# Patient Record
Sex: Female | Born: 1960
Health system: Southern US, Community
[De-identification: ages and names within clinical notes are randomized; demographics above are authoritative.]

## PROBLEM LIST (undated history)

## (undated) DIAGNOSIS — F988 Other specified behavioral and emotional disorders with onset usually occurring in childhood and adolescence: Secondary | ICD-10-CM

## (undated) DIAGNOSIS — Z9109 Other allergy status, other than to drugs and biological substances: Secondary | ICD-10-CM

## (undated) DIAGNOSIS — I10 Essential (primary) hypertension: Secondary | ICD-10-CM

## (undated) DIAGNOSIS — K589 Irritable bowel syndrome without diarrhea: Secondary | ICD-10-CM

## (undated) DIAGNOSIS — F419 Anxiety disorder, unspecified: Secondary | ICD-10-CM

## (undated) DIAGNOSIS — J3081 Allergic rhinitis due to animal (cat) (dog) hair and dander: Secondary | ICD-10-CM

## (undated) HISTORY — DX: Anxiety disorder, unspecified: F41.9

## (undated) HISTORY — PX: SKIN TAG REMOVAL: SHX780

## (undated) HISTORY — DX: Other specified behavioral and emotional disorders with onset usually occurring in childhood and adolescence: F98.8

## (undated) HISTORY — DX: Irritable bowel syndrome, unspecified: K58.9

## (undated) HISTORY — PX: WISDOM TOOTH EXTRACTION: SHX21

## (undated) HISTORY — DX: Other allergy status, other than to drugs and biological substances: Z91.09

## (undated) HISTORY — DX: Allergic rhinitis due to animal (cat) (dog) hair and dander: J30.81

---

## 2012-03-08 ENCOUNTER — Encounter (HOSPITAL_COMMUNITY): Payer: Self-pay

## 2012-03-08 ENCOUNTER — Emergency Department (HOSPITAL_COMMUNITY)
Admission: EM | Admit: 2012-03-08 | Discharge: 2012-03-08 | Disposition: A | Discharge status: Home / Self Care | Payer: BC Managed Care – PPO | Attending: Emergency Medicine | Admitting: Emergency Medicine

## 2012-03-08 ENCOUNTER — Emergency Department (HOSPITAL_COMMUNITY): Payer: BC Managed Care – PPO

## 2012-03-08 DIAGNOSIS — R072 Precordial pain: Secondary | ICD-10-CM | POA: Insufficient documentation

## 2012-03-08 DIAGNOSIS — I10 Essential (primary) hypertension: Secondary | ICD-10-CM | POA: Insufficient documentation

## 2012-03-08 HISTORY — DX: Essential (primary) hypertension: I10

## 2012-03-08 LAB — CBC
HCT: 41 % (ref 36.0–46.0)
MCH: 31.4 pg (ref 26.0–34.0)
MCHC: 34.4 g/dL (ref 30.0–36.0)
MCV: 91.3 fL (ref 78.0–100.0)
Platelets: 274 10*3/uL (ref 150–400)
RDW: 13.1 % (ref 11.5–15.5)

## 2012-03-08 LAB — BASIC METABOLIC PANEL
BUN: 12 mg/dL (ref 6–23)
Calcium: 9.7 mg/dL (ref 8.4–10.5)
Creatinine, Ser: 0.63 mg/dL (ref 0.50–1.10)
GFR calc Af Amer: >90 mL/min (ref >90)
GFR calc non Af Amer: >90 mL/min (ref >90)

## 2012-03-08 NOTE — Discharge Instructions (Signed)
Chest Pain (Nonspecific) It is often hard to give a specific diagnosis for the cause of chest pain. There is always a chance that your pain could be related to something serious, such as a heart attack or a blood clot in the lungs. You need to follow up with your caregiver for further evaluation. CAUSES   Heartburn.   Pneumonia or bronchitis.   Anxiety or stress.   Inflammation around your heart (pericarditis) or lung (pleuritis or pleurisy).   A blood clot in the lung.   A collapsed lung (pneumothorax). It can develop suddenly on its own (spontaneous pneumothorax) or from injury (trauma) to the chest.   Shingles infection (herpes zoster virus).  The chest wall is composed of bones, muscles, and cartilage. Any of these can be the source of the pain.  The bones can be bruised by injury.   The muscles or cartilage can be strained by coughing or overwork.   The cartilage can be affected by inflammation and become sore (costochondritis).  DIAGNOSIS  Lab tests or other studies, such as X-rays, electrocardiography, stress testing, or cardiac imaging, may be needed to find the cause of your pain.  TREATMENT   Treatment depends on what may be causing your chest pain. Treatment may include:   Acid blockers for heartburn.   Anti-inflammatory medicine.   Pain medicine for inflammatory conditions.   Antibiotics if an infection is present.   You may be advised to change lifestyle habits. This includes stopping smoking and avoiding alcohol, caffeine, and chocolate.   You may be advised to keep your head raised (elevated) when sleeping. This reduces the chance of acid going backward from your stomach into your esophagus.   Most of the time, nonspecific chest pain will improve within 2 to 3 days with rest and mild pain medicine.  HOME CARE INSTRUCTIONS   If antibiotics were prescribed, take your antibiotics as directed. Finish them even if you start to feel better.   For the next few  days, avoid physical activities that bring on chest pain. Continue physical activities as directed.   Do not smoke.   Avoid drinking alcohol.   Only take over-the-counter or prescription medicine for pain, discomfort, or fever as directed by your caregiver.   Follow your caregiver's suggestions for further testing if your chest pain does not go away.   Keep any follow-up appointments you made. If you do not go to an appointment, you could develop lasting (chronic) problems with pain. If there is any problem keeping an appointment, you must call to reschedule.  SEEK MEDICAL CARE IF:   You think you are having problems from the medicine you are taking. Read your medicine instructions carefully.   Your chest pain does not go away, even after treatment.   You develop a rash with blisters on your chest.  SEEK IMMEDIATE MEDICAL CARE IF:   You have increased chest pain or pain that spreads to your arm, neck, jaw, back, or abdomen.   You develop shortness of breath, an increasing cough, or you are coughing up blood.   You have severe back or abdominal pain, feel nauseous, or vomit.   You develop severe weakness, fainting, or chills.   You have a fever.  THIS IS AN EMERGENCY. Do not wait to see if the pain will go away. Get medical help at once. Call your local emergency services (911 in [place] Do not drive yourself to the hospital. MAKE SURE YOU:   Understand these instructions.     Will watch your condition.   Will get help right away if you are not doing well or get worse.  Document Released: 07/31/2005 Document Revised: 10/10/2011 Document Reviewed: 05/26/2008 Exit[employer] Patient Information 2012 [employer]. 

## 2012-03-08 NOTE — ED Notes (Signed)
Patient denies radiation of pain or SOB.

## 2012-03-08 NOTE — ED Notes (Signed)
Pt in from home with acute onset of chest pain states pain is in the center of chest (tightness) non radiating, pt denies sob/dizziness/nausea pt states a hx of anxiety

## 2012-03-08 NOTE — ED Notes (Signed)
Previous charting entry incorrect, for another pt

## 2012-03-08 NOTE — ED Provider Notes (Signed)
History     CSN: [id number]  Arrival date & time 03/08/12  1257   First MD Initiated Contact with Patient 03/08/12 1326      Chief Complaint  Patient presents with  . Chest Pain    (Consider location/radiation/quality/duration/timing/severity/associated sxs/prior treatment) The history is provided by the patient.   51 year old female with a past medical history of controlled hypertension presents to the emergency department with a chief complaint of acute onset substernal and left precordial chest pain at 1245 this afternoon. Pain began at rest, rated 6/10, nonradiating, constant, alternatingly tight and sharp. Of note, the patient reports that she took her normal Ritalin dose and to drink a large cup of coffee approximately 45 minutes before the symptoms started. Endorses increased stress recently as she has been studying for graduate school exams. Has also had recent URI, with sx present until yesterday. Pain worse with palpation, non-exertional. No identifiable alleviating factors. Took an adult-strength ASA today at the recommendation of her husband. Has had stress test and echo in 2002 to eval for CP that was thought to be related to anxiety, these were negative for concerning findings. FH sig for MI in father at 40 and CAD in multiple other family members.   Past Medical History  Diagnosis Date  . Hypertension     History reviewed. No pertinent past surgical history.  No family history on file.  History  Substance Use Topics  . Smoking status: Never Smoker   . Smokeless tobacco: Not on file  . Alcohol Use: Yes    Review of Systems 10 systems reviewed and are negative for acute change except as noted in the HPI.  Allergies  Cyclinex  Home Medications   Current Outpatient Rx  Name Route Sig Dispense Refill  . BUSPIRONE HCL 30 MG PO TABS Oral Take 30 mg by mouth 2 (two) times daily.    Marland Kitchen CITALOPRAM HYDROBROMIDE 40 MG PO TABS Oral Take 40 mg by mouth daily.    Marland Kitchen  FEXOFENADINE HCL 180 MG PO TABS Oral Take 180 mg by mouth daily.    Marland Kitchen HYDROCHLOROTHIAZIDE 25 MG PO TABS Oral Take 25 mg by mouth daily.    . IRBESARTAN 150 MG PO TABS Oral Take 150 mg by mouth daily.    . TRAZODONE HCL 50 MG PO TABS Oral Take 25 mg by mouth at bedtime.      BP 117/61  Pulse 81  Resp 20  SpO2 99%  Physical Exam  Nursing note and vitals reviewed. Constitutional: She is oriented to person, place, and time. She appears well-developed and well-nourished.       Anxious appearing, tearful  HENT:  Head: Normocephalic and atraumatic.  Mouth/Throat: Oropharynx is clear and moist. No oropharyngeal exudate.  Eyes: Conjunctivae are normal. Pupils are equal, round, and reactive to light.  Neck: Normal range of motion. Neck supple.  Cardiovascular: Normal rate, regular rhythm, normal heart sounds and intact distal pulses.  Exam reveals no friction rub.   No murmur heard. Pulmonary/Chest: Effort normal and breath sounds normal. No respiratory distress. She has no wheezes. She exhibits no tenderness.  Abdominal: Soft. Bowel sounds are normal. She exhibits no distension. There is no tenderness. There is no guarding.  Musculoskeletal: She exhibits no edema and no tenderness.  Neurological: She is alert and oriented to person, place, and time.       Normal speech  Skin: Skin is warm and dry.    ED Course  Procedures (including critical care time)  Labs Reviewed  CBC  BASIC METABOLIC PANEL  TROPONIN I   Dg Chest 2 View  03/08/2012  *RADIOLOGY REPORT*  Clinical Data: Chest pain  CHEST - 2 VIEW  Comparison: None.  Findings: No pneumothorax. Lungs clear.  Heart size and pulmonary vascularity normal.  No effusion.  Visualized bones unremarkable.  IMPRESSION: No acute disease  Original Report Authenticated By: Osa Craver, M.D.    Date: 03/08/2012  Rate: 83  Rhythm: normal sinus rhythm  QRS Axis: normal  Intervals: normal  ST/T Wave abnormalities: normal  Conduction  Disutrbances: none  Old EKG Reviewed: No prior EKG for comparison     Dx 1: Chest pain   MDM  CP with RF of HTN, Fam Hx. CP has improved in ED. Negative troponin x 2. CXR unremarkable. EKG unremarkable. CBC and BMP WNL. Pt will follow-up with PCP in the next few days to schedule further outpatient workup. Return precautions discussed. Pt and spouse agree with plan.        Shaaron Adler, [place] 03/08/12 1932

## 2012-03-08 NOTE — ED Notes (Signed)
Patient reports that the pain has decreased and is feeling more like muscular type pain.

## 2012-03-10 NOTE — ED Provider Notes (Signed)
Medical screening examination/treatment/procedure(s) were conducted as a shared visit with non-physician practitioner(s) and myself.  I personally evaluated the patient during the encounter  RRR, CTAB  Forbes Cellar, MD 03/10/12 0131

## 2012-09-02 ENCOUNTER — Other Ambulatory Visit (HOSPITAL_COMMUNITY)
Admission: RE | Admit: 2012-09-02 | Discharge: 2012-09-02 | Disposition: A | Discharge status: Home / Self Care | Payer: BC Managed Care – PPO | Source: Ambulatory Visit | Attending: Internal Medicine | Admitting: Internal Medicine

## 2012-09-02 DIAGNOSIS — Z01419 Encounter for gynecological examination (general) (routine) without abnormal findings: Secondary | ICD-10-CM | POA: Insufficient documentation

## 2013-07-20 ENCOUNTER — Telehealth (HOSPITAL_COMMUNITY): Payer: Self-pay | Admitting: *Deleted

## 2013-07-20 ENCOUNTER — Other Ambulatory Visit (HOSPITAL_COMMUNITY): Payer: Self-pay | Admitting: Internal Medicine

## 2013-07-20 DIAGNOSIS — I341 Nonrheumatic mitral (valve) prolapse: Secondary | ICD-10-CM

## 2013-07-27 ENCOUNTER — Ambulatory Visit (HOSPITAL_COMMUNITY)
Admission: RE | Admit: 2013-07-27 | Discharge: 2013-07-27 | Disposition: A | Discharge status: Home / Self Care | Payer: BC Managed Care – PPO | Source: Ambulatory Visit | Attending: Cardiology | Admitting: Cardiology

## 2013-07-27 DIAGNOSIS — I10 Essential (primary) hypertension: Secondary | ICD-10-CM | POA: Insufficient documentation

## 2013-07-27 DIAGNOSIS — I059 Rheumatic mitral valve disease, unspecified: Secondary | ICD-10-CM

## 2013-07-27 DIAGNOSIS — I341 Nonrheumatic mitral (valve) prolapse: Secondary | ICD-10-CM

## 2013-07-27 NOTE — Progress Notes (Signed)
2D Echo Performed 07/27/2013    Clearence Ped, RCS

## 2015-10-31 ENCOUNTER — Other Ambulatory Visit (HOSPITAL_COMMUNITY)
Admission: RE | Admit: 2015-10-31 | Discharge: 2015-10-31 | Disposition: A | Discharge status: Home / Self Care | Payer: BLUE CROSS/BLUE SHIELD | Source: Ambulatory Visit | Attending: Internal Medicine | Admitting: Internal Medicine

## 2015-10-31 DIAGNOSIS — Z01419 Encounter for gynecological examination (general) (routine) without abnormal findings: Secondary | ICD-10-CM | POA: Insufficient documentation

## 2015-11-01 ENCOUNTER — Encounter: Payer: Self-pay | Admitting: Gastroenterology

## 2015-11-20 ENCOUNTER — Ambulatory Visit (AMBULATORY_SURGERY_CENTER): Payer: Self-pay

## 2015-11-20 VITALS — Ht 64.5 in

## 2015-11-20 DIAGNOSIS — Z1211 Encounter for screening for malignant neoplasm of colon: Secondary | ICD-10-CM

## 2015-11-20 NOTE — Progress Notes (Signed)
No allergies to eggs or soy No diet/weight loss meds No home oxygen No past problems with anesthesia  Has email and internet; registered emmi  Refused weight check; stated she weighs between 210-215lbs

## 2015-11-29 ENCOUNTER — Encounter: Payer: Self-pay | Admitting: Gastroenterology

## 2015-11-29 ENCOUNTER — Ambulatory Visit (AMBULATORY_SURGERY_CENTER): Payer: BLUE CROSS/BLUE SHIELD | Admitting: Gastroenterology

## 2015-11-29 VITALS — BP 137/98 | HR 83 | Temp 98.3°F | Resp 27 | Ht 64.5 in | Wt 215.0 lb

## 2015-11-29 DIAGNOSIS — Z1211 Encounter for screening for malignant neoplasm of colon: Secondary | ICD-10-CM | POA: Diagnosis not present

## 2015-11-29 MED ORDER — SODIUM CHLORIDE 0.9 % IV SOLN
500.0000 mL | INTRAVENOUS | Status: DC
Start: 2015-11-29 — End: 2015-11-29

## 2015-11-29 NOTE — Patient Instructions (Signed)
YOU HAD AN ENDOSCOPIC PROCEDURE TODAY AT THE [facility]:   Refer to the procedure report that was given to you for any specific questions about what was found during the examination.  If the procedure report does not answer your questions, please call your gastroenterologist to clarify.  If you requested that your care partner not be given the details of your procedure findings, then the procedure report has been included in a sealed envelope for you to review at your convenience later.  YOU SHOULD EXPECT: Some feelings of bloating in the abdomen. Passage of more gas than usual.  Walking can help get rid of the air that was put into your GI tract during the procedure and reduce the bloating. If you had a lower endoscopy (such as a colonoscopy or flexible sigmoidoscopy) you may notice spotting of blood in your stool or on the toilet paper. If you underwent a bowel prep for your procedure, you may not have a normal bowel movement for a few days.  Please Note:  You might notice some irritation and congestion in your nose or some drainage.  This is from the oxygen used during your procedure.  There is no need for concern and it should clear up in a day or so.  SYMPTOMS TO REPORT IMMEDIATELY:   Following lower endoscopy (colonoscopy or flexible sigmoidoscopy):  Excessive amounts of blood in the stool  Significant tenderness or worsening of abdominal pains  Swelling of the abdomen that is new, acute  Fever of 100F or higher   For urgent or emergent issues, a gastroenterologist can be reached at any hour by calling [phone].   DIET: Your first meal following the procedure should be a small meal and then it is ok to progress to your normal diet. Heavy or fried foods are harder to digest and may make you feel nauseous or bloated.  Likewise, meals heavy in dairy and vegetables can increase bloating.  Drink plenty of fluids but you should avoid alcoholic beverages for 24  hours.  ACTIVITY:  You should plan to take it easy for the rest of today and you should NOT DRIVE or use heavy machinery until tomorrow (because of the sedation medicines used during the test).    FOLLOW UP: Our staff will call the number listed on your records the next business day following your procedure to check on you and address any questions or concerns that you may have regarding the information given to you following your procedure. If we do not reach you, we will leave a message.  However, if you are feeling well and you are not experiencing any problems, there is no need to return our call.  We will assume that you have returned to your regular daily activities without incident.  If any biopsies were taken you will be contacted by phone or by letter within the next 1-3 weeks.  Please call us at [phone] if you have not heard about the biopsies in 3 weeks.    SIGNATURES/CONFIDENTIALITY: You and/or your care partner have signed paperwork which will be entered into your electronic medical record.  These signatures attest to the fact that that the information above on your After Visit Summary has been reviewed and is understood.  Full responsibility of the confidentiality of this discharge information lies with you and/or your care-partner. 

## 2015-11-29 NOTE — Progress Notes (Signed)
Report to PACU, RN, vss, BBS= Clear.  

## 2015-11-29 NOTE — Op Note (Signed)
[facility] [address] [place], [address]   COLONOSCOPY PROCEDURE REPORT  PATIENT: Desiree Simpson, Desiree Simpson  MR#: [id number] BIRTHDATE: 1961/10/07 , 54  yrs. old GENDER: female ENDOSCOPIST: Marsa Aris, MD REFERRED [id number] Terri Piedra, M.D. PROCEDURE DATE:  11/29/2015 PROCEDURE:   Colonoscopy, screening First Screening Colonoscopy - Avg.  risk and is 50 yrs.  old or older Yes.  Prior Negative Screening - Now for repeat screening. N/A  History of Adenoma - Now for follow-up colonoscopy & has been > or = to 3 yrs.  N/A  Polyps removed today? No Recommend repeat exam, <10 yrs? No ASA CLASS:   Class II INDICATIONS:Screening for colonic neoplasia and Colorectal Neoplasm Risk Assessment for this procedure is average risk. MEDICATIONS: Propofol 540 mg IV  DESCRIPTION OF PROCEDURE:   After the risks benefits and alternatives of the procedure were thoroughly explained, informed consent was obtained.  The digital rectal exam revealed no abnormalities of the rectum.   The LB [id number]  endoscope was introduced through the anus and advanced to the cecum, which was identified by both the appendix and ileocecal valve. No adverse events experienced.   The quality of the prep was good.  The instrument was then slowly withdrawn as the colon was fully examined. Estimated blood loss is zero unless otherwise noted in this procedure report.   COLON FINDINGS: A normal appearing cecum, ileocecal valve, and appendiceal orifice were identified.  The ascending, transverse, descending, sigmoid colon, and rectum appeared unremarkable. Retroflexed views revealed internal hemorrhoids. The time to cecum = 6.1 Withdrawal time = 13.8   The scope was withdrawn and the procedure completed. COMPLICATIONS: There were no immediate complications.  ENDOSCOPIC IMPRESSION: Normal colonoscopy  RECOMMENDATIONS: You should continue to follow colorectal cancer screening guidelines for  "routine risk" patients with a repeat colonoscopy in 10 years. There is no need for FOBT (stool) testing for at least 5 years.  eSigned:  Marsa Aris, MD 11/29/2015 11:25 AM

## 2015-11-30 ENCOUNTER — Telehealth: Payer: Self-pay

## 2015-11-30 NOTE — Telephone Encounter (Signed)
  Follow up Call-  Call back number 11/29/2015  Post procedure Call Back phone  # [phone]  Permission to leave phone message Yes     Patient was called for follow up after her procedure on 11/29/2015. No answer at the number given for follow up phone call. A message was left on her answering machine.

## 2016-02-05 DIAGNOSIS — F4323 Adjustment disorder with mixed anxiety and depressed mood: Secondary | ICD-10-CM | POA: Diagnosis not present

## 2016-05-30 DIAGNOSIS — R509 Fever, unspecified: Secondary | ICD-10-CM | POA: Diagnosis not present

## 2016-05-30 DIAGNOSIS — J069 Acute upper respiratory infection, unspecified: Secondary | ICD-10-CM | POA: Diagnosis not present

## 2016-05-30 DIAGNOSIS — R05 Cough: Secondary | ICD-10-CM | POA: Diagnosis not present

## 2016-05-31 DIAGNOSIS — J189 Pneumonia, unspecified organism: Secondary | ICD-10-CM | POA: Diagnosis not present

## 2016-06-03 DIAGNOSIS — J45909 Unspecified asthma, uncomplicated: Secondary | ICD-10-CM | POA: Diagnosis not present

## 2016-06-03 DIAGNOSIS — R062 Wheezing: Secondary | ICD-10-CM | POA: Diagnosis not present

## 2016-06-03 DIAGNOSIS — J189 Pneumonia, unspecified organism: Secondary | ICD-10-CM | POA: Diagnosis not present

## 2016-07-01 DIAGNOSIS — J189 Pneumonia, unspecified organism: Secondary | ICD-10-CM | POA: Diagnosis not present

## 2016-07-10 DIAGNOSIS — F900 Attention-deficit hyperactivity disorder, predominantly inattentive type: Secondary | ICD-10-CM | POA: Diagnosis not present

## 2016-10-23 DIAGNOSIS — Z23 Encounter for immunization: Secondary | ICD-10-CM | POA: Diagnosis not present

## 2016-11-13 DIAGNOSIS — J189 Pneumonia, unspecified organism: Secondary | ICD-10-CM | POA: Diagnosis not present

## 2016-11-13 DIAGNOSIS — Z8701 Personal history of pneumonia (recurrent): Secondary | ICD-10-CM | POA: Diagnosis not present

## 2016-11-13 DIAGNOSIS — R6889 Other general symptoms and signs: Secondary | ICD-10-CM | POA: Diagnosis not present

## 2016-11-13 DIAGNOSIS — R5383 Other fatigue: Secondary | ICD-10-CM | POA: Diagnosis not present

## 2016-11-13 DIAGNOSIS — R509 Fever, unspecified: Secondary | ICD-10-CM | POA: Diagnosis not present

## 2017-01-07 DIAGNOSIS — M546 Pain in thoracic spine: Secondary | ICD-10-CM | POA: Diagnosis not present

## 2017-01-09 DIAGNOSIS — M546 Pain in thoracic spine: Secondary | ICD-10-CM | POA: Diagnosis not present

## 2017-01-14 DIAGNOSIS — M546 Pain in thoracic spine: Secondary | ICD-10-CM | POA: Diagnosis not present

## 2017-01-16 DIAGNOSIS — M546 Pain in thoracic spine: Secondary | ICD-10-CM | POA: Diagnosis not present

## 2017-01-21 DIAGNOSIS — M546 Pain in thoracic spine: Secondary | ICD-10-CM | POA: Diagnosis not present

## 2017-01-27 DIAGNOSIS — F411 Generalized anxiety disorder: Secondary | ICD-10-CM | POA: Diagnosis not present

## 2017-01-27 DIAGNOSIS — F900 Attention-deficit hyperactivity disorder, predominantly inattentive type: Secondary | ICD-10-CM | POA: Diagnosis not present

## 2017-07-21 DIAGNOSIS — F411 Generalized anxiety disorder: Secondary | ICD-10-CM | POA: Diagnosis not present

## 2017-07-21 DIAGNOSIS — F900 Attention-deficit hyperactivity disorder, predominantly inattentive type: Secondary | ICD-10-CM | POA: Diagnosis not present

## 2017-07-21 DIAGNOSIS — G4709 Other insomnia: Secondary | ICD-10-CM | POA: Diagnosis not present

## 2017-10-20 DIAGNOSIS — J019 Acute sinusitis, unspecified: Secondary | ICD-10-CM | POA: Diagnosis not present

## 2017-10-20 DIAGNOSIS — J4521 Mild intermittent asthma with (acute) exacerbation: Secondary | ICD-10-CM | POA: Diagnosis not present

## 2017-10-23 DIAGNOSIS — R509 Fever, unspecified: Secondary | ICD-10-CM | POA: Diagnosis not present

## 2017-10-23 DIAGNOSIS — J019 Acute sinusitis, unspecified: Secondary | ICD-10-CM | POA: Diagnosis not present

## 2017-10-23 DIAGNOSIS — R05 Cough: Secondary | ICD-10-CM | POA: Diagnosis not present

## 2017-10-23 DIAGNOSIS — J45909 Unspecified asthma, uncomplicated: Secondary | ICD-10-CM | POA: Diagnosis not present

## 2017-11-10 DIAGNOSIS — H10023 Other mucopurulent conjunctivitis, bilateral: Secondary | ICD-10-CM | POA: Diagnosis not present

## 2017-11-17 DIAGNOSIS — I10 Essential (primary) hypertension: Secondary | ICD-10-CM | POA: Diagnosis not present

## 2017-11-17 DIAGNOSIS — Z0000 Encounter for general adult medical examination without abnormal findings: Secondary | ICD-10-CM | POA: Diagnosis not present

## 2017-11-26 DIAGNOSIS — Z01419 Encounter for gynecological examination (general) (routine) without abnormal findings: Secondary | ICD-10-CM | POA: Diagnosis not present

## 2017-11-26 DIAGNOSIS — R779 Abnormality of plasma protein, unspecified: Secondary | ICD-10-CM | POA: Diagnosis not present

## 2017-11-26 DIAGNOSIS — I10 Essential (primary) hypertension: Secondary | ICD-10-CM | POA: Diagnosis not present

## 2017-11-26 DIAGNOSIS — Z1212 Encounter for screening for malignant neoplasm of rectum: Secondary | ICD-10-CM | POA: Diagnosis not present

## 2017-11-26 DIAGNOSIS — Z0000 Encounter for general adult medical examination without abnormal findings: Secondary | ICD-10-CM | POA: Diagnosis not present

## 2017-11-28 DIAGNOSIS — Z01419 Encounter for gynecological examination (general) (routine) without abnormal findings: Secondary | ICD-10-CM | POA: Diagnosis not present

## 2017-11-28 DIAGNOSIS — Z1151 Encounter for screening for human papillomavirus (HPV): Secondary | ICD-10-CM | POA: Diagnosis not present

## 2017-12-08 DIAGNOSIS — H15101 Unspecified episcleritis, right eye: Secondary | ICD-10-CM | POA: Diagnosis not present

## 2017-12-08 DIAGNOSIS — H01009 Unspecified blepharitis unspecified eye, unspecified eyelid: Secondary | ICD-10-CM | POA: Diagnosis not present

## 2017-12-08 DIAGNOSIS — H04123 Dry eye syndrome of bilateral lacrimal glands: Secondary | ICD-10-CM | POA: Diagnosis not present

## 2017-12-22 DIAGNOSIS — H01009 Unspecified blepharitis unspecified eye, unspecified eyelid: Secondary | ICD-10-CM | POA: Diagnosis not present

## 2017-12-22 DIAGNOSIS — H04123 Dry eye syndrome of bilateral lacrimal glands: Secondary | ICD-10-CM | POA: Diagnosis not present

## 2017-12-22 DIAGNOSIS — H15101 Unspecified episcleritis, right eye: Secondary | ICD-10-CM | POA: Diagnosis not present

## 2017-12-22 DIAGNOSIS — H40053 Ocular hypertension, bilateral: Secondary | ICD-10-CM | POA: Diagnosis not present

## 2018-01-12 DIAGNOSIS — F900 Attention-deficit hyperactivity disorder, predominantly inattentive type: Secondary | ICD-10-CM | POA: Diagnosis not present

## 2018-01-12 DIAGNOSIS — F3342 Major depressive disorder, recurrent, in full remission: Secondary | ICD-10-CM | POA: Diagnosis not present

## 2018-01-27 DIAGNOSIS — L918 Other hypertrophic disorders of the skin: Secondary | ICD-10-CM | POA: Diagnosis not present

## 2018-01-27 DIAGNOSIS — L219 Seborrheic dermatitis, unspecified: Secondary | ICD-10-CM | POA: Diagnosis not present

## 2018-02-02 DIAGNOSIS — L718 Other rosacea: Secondary | ICD-10-CM | POA: Diagnosis not present

## 2018-02-17 DIAGNOSIS — R0602 Shortness of breath: Secondary | ICD-10-CM | POA: Diagnosis not present

## 2018-02-17 DIAGNOSIS — J45998 Other asthma: Secondary | ICD-10-CM | POA: Diagnosis not present

## 2018-02-17 DIAGNOSIS — J309 Allergic rhinitis, unspecified: Secondary | ICD-10-CM | POA: Diagnosis not present

## 2018-03-18 DIAGNOSIS — F4325 Adjustment disorder with mixed disturbance of emotions and conduct: Secondary | ICD-10-CM | POA: Diagnosis not present

## 2018-04-01 DIAGNOSIS — F4325 Adjustment disorder with mixed disturbance of emotions and conduct: Secondary | ICD-10-CM | POA: Diagnosis not present

## 2018-04-15 DIAGNOSIS — F4325 Adjustment disorder with mixed disturbance of emotions and conduct: Secondary | ICD-10-CM | POA: Diagnosis not present

## 2018-05-06 DIAGNOSIS — F4325 Adjustment disorder with mixed disturbance of emotions and conduct: Secondary | ICD-10-CM | POA: Diagnosis not present

## 2018-05-20 DIAGNOSIS — F4325 Adjustment disorder with mixed disturbance of emotions and conduct: Secondary | ICD-10-CM | POA: Diagnosis not present

## 2018-05-26 DIAGNOSIS — F419 Anxiety disorder, unspecified: Secondary | ICD-10-CM | POA: Diagnosis not present

## 2018-05-26 DIAGNOSIS — I10 Essential (primary) hypertension: Secondary | ICD-10-CM | POA: Diagnosis not present

## 2018-05-26 DIAGNOSIS — J309 Allergic rhinitis, unspecified: Secondary | ICD-10-CM | POA: Diagnosis not present

## 2018-05-26 DIAGNOSIS — J4520 Mild intermittent asthma, uncomplicated: Secondary | ICD-10-CM | POA: Diagnosis not present

## 2018-06-03 DIAGNOSIS — F4325 Adjustment disorder with mixed disturbance of emotions and conduct: Secondary | ICD-10-CM | POA: Diagnosis not present

## 2018-06-24 DIAGNOSIS — F4325 Adjustment disorder with mixed disturbance of emotions and conduct: Secondary | ICD-10-CM | POA: Diagnosis not present

## 2018-07-07 DIAGNOSIS — F411 Generalized anxiety disorder: Secondary | ICD-10-CM | POA: Diagnosis not present

## 2018-07-07 DIAGNOSIS — F900 Attention-deficit hyperactivity disorder, predominantly inattentive type: Secondary | ICD-10-CM | POA: Diagnosis not present

## 2018-07-15 DIAGNOSIS — F4325 Adjustment disorder with mixed disturbance of emotions and conduct: Secondary | ICD-10-CM | POA: Diagnosis not present

## 2018-07-29 DIAGNOSIS — F4325 Adjustment disorder with mixed disturbance of emotions and conduct: Secondary | ICD-10-CM | POA: Diagnosis not present

## 2018-08-19 DIAGNOSIS — F4325 Adjustment disorder with mixed disturbance of emotions and conduct: Secondary | ICD-10-CM | POA: Diagnosis not present

## 2018-09-03 DIAGNOSIS — F4325 Adjustment disorder with mixed disturbance of emotions and conduct: Secondary | ICD-10-CM | POA: Diagnosis not present

## 2018-09-16 DIAGNOSIS — F4325 Adjustment disorder with mixed disturbance of emotions and conduct: Secondary | ICD-10-CM | POA: Diagnosis not present

## 2018-09-29 DIAGNOSIS — F4325 Adjustment disorder with mixed disturbance of emotions and conduct: Secondary | ICD-10-CM | POA: Diagnosis not present

## 2018-10-22 DIAGNOSIS — F4325 Adjustment disorder with mixed disturbance of emotions and conduct: Secondary | ICD-10-CM | POA: Diagnosis not present

## 2018-11-19 DIAGNOSIS — F4325 Adjustment disorder with mixed disturbance of emotions and conduct: Secondary | ICD-10-CM | POA: Diagnosis not present

## 2018-11-20 DIAGNOSIS — J019 Acute sinusitis, unspecified: Secondary | ICD-10-CM | POA: Diagnosis not present

## 2018-11-26 DIAGNOSIS — Z0000 Encounter for general adult medical examination without abnormal findings: Secondary | ICD-10-CM | POA: Diagnosis not present

## 2018-12-01 DIAGNOSIS — Z0000 Encounter for general adult medical examination without abnormal findings: Secondary | ICD-10-CM | POA: Diagnosis not present

## 2018-12-03 DIAGNOSIS — F4325 Adjustment disorder with mixed disturbance of emotions and conduct: Secondary | ICD-10-CM | POA: Diagnosis not present

## 2018-12-17 ENCOUNTER — Ambulatory Visit (INDEPENDENT_AMBULATORY_CARE_PROVIDER_SITE_OTHER): Payer: BLUE CROSS/BLUE SHIELD | Admitting: Internal Medicine

## 2018-12-17 ENCOUNTER — Encounter: Payer: Self-pay | Admitting: Internal Medicine

## 2018-12-17 VITALS — BP 124/86 | HR 100 | Temp 98.1°F

## 2018-12-17 DIAGNOSIS — J45991 Cough variant asthma: Secondary | ICD-10-CM

## 2018-12-17 DIAGNOSIS — J310 Chronic rhinitis: Secondary | ICD-10-CM

## 2018-12-17 LAB — NITRIC OXIDE: NITRIC OXIDE: 13

## 2018-12-17 MED ORDER — MONTELUKAST SODIUM 10 MG PO TABS: 10.0000 mg | ORAL_TABLET | Freq: Every day | ORAL | 11 refills | Status: DC

## 2018-12-17 MED ORDER — BUDESONIDE-FORMOTEROL FUMARATE 80-4.5 MCG/ACT IN AERO: 2.0000 | INHALATION_SPRAY | Freq: Two times a day (BID) | RESPIRATORY_TRACT | 11 refills | Status: DC

## 2018-12-17 NOTE — Progress Notes (Signed)
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 Pulmonary Consult    Referred by Merri Brunette. Pt states dxed with Asthma approx 2 years ago. She c/o having a cold for the past few wks- cough with green sputum and wheezing. She is using her albuterol inhaler about 2 x per day.   Dyspnea:  Baseline able to Heavy house work / up and down steps / worse with cat exp Cough: worse x sev weeks/rx mucinex/so restarted advair 10 days and not really better up to twice daily with minimal sputum  Sleep: some sense of drainage disturbs sleep  SABA use: last 9 pm prior to OV    No obvious day to day or daytime variability or assoc   mucus plugs or hemoptysis or cp or chest tightness,   or overt   hb symptoms.     Also denies any obvious fluctuation of symptoms with weather or environmental changes or other aggravating or alleviating factors except as outlined above   No unusual exposure hx or h/o childhood pna/ asthma or knowledge of premature birth.  Current Allergies, Complete Past Medical History, Past Surgical History, Family History, and Social History were reviewed in [employer] record.  ROS  The following are not active complaints unless bolded Hoarseness, sore throat, dysphagia, dental problems, itching, sneezing,  nasal congestion or  discharge of excess mucus or purulent secretions, ear ache,   fever, chills, sweats, unintended wt loss or wt gain, classically pleuritic or exertional cp,  orthopnea pnd or arm/hand swelling  or leg swelling, presyncope, palpitations, abdominal pain, anorexia, nausea, vomiting, diarrhea  or change in bowel habits or change in bladder habits, change in stools or change in urine, dysuria, hematuria,  rash, arthralgias, visual complaints, headache, numbness, weakness or ataxia or problems with walking or coordination,  change in mood or  memory.           Past Medical History:  Diagnosis Date  . ADD (attention deficit disorder)   . Anxiety   . Cat allergies   . Environmental allergies   . Hypertension   . Irritable bowel syndrome (IBS)     Outpatient Medications Prior to Visit  Medication Sig Dispense Refill  . busPIRone (BUSPAR) 30 MG tablet Take 30 mg by mouth 2 (two) times daily.    . citalopram (CELEXA) 40 MG tablet Take 40 mg by mouth daily.    . Fluticasone-Salmeterol (ADVAIR) 250-50 MCG/DOSE AEPB Inhale 1 puff into the lungs 2 (two) times daily.    . hydrochlorothiazide (HYDRODIURIL) 25 MG tablet Take 25 mg by mouth daily.    . irbesartan (AVAPRO) 150 MG tablet Take 150 mg by mouth daily.    Marland Kitchen levocetirizine (XYZAL) 5 MG tablet Take 5 mg by mouth every evening.    Marland Kitchen  methylphenidate 36 MG PO CR tablet Take 36 mg by mouth daily.    . temazepam (RESTORIL) 7.5 MG capsule Take 7.5 mg by mouth at bedtime as needed for sleep.    . VENTOLIN HFA 108 (90 Base) MCG/ACT inhaler INL 2 PFS PO Q 4 TO 6 H PRF WHZ  0  . cetirizine (ZYRTEC) 10 MG tablet Take 10 mg by mouth daily.       Objective:     BP 124/86 (BP Location: Left Arm, Cuff Size: Normal)   Pulse 100   Temp 98.1 F (36.7 C) (Oral)   SpO2 97%   SpO2: 97 %   RA/ refused to weigh  amb somewhat anxious wf nad / challenging historian with multiple redirects needed  HEENT: nl dentition,  and oropharynx. Nl external ear canals  without cough reflex - mod bilateral non-specific turbinate edema     NECK :  without JVD/Nodes/TM/ nl carotid upstrokes bilaterally   LUNGS: no acc muscle use,  Nl contour chest with minimal insp/exp rhonchi  bilaterally without cough on insp or exp maneuvers   CV:  RRR  no s3 or murmur or increase in P2, and no edema   ABD:  soft and nontender with nl inspiratory excursion in the supine position. No bruits or organomegaly appreciated, bowel sounds nl  MS:  Nl gait/ ext warm without deformities, calf tenderness, cyanosis or clubbing No obvious joint restrictions   SKIN: warm and dry without lesions    NEURO:  alert, approp, nl sensorium with  no motor or cerebellar deficits apparent.       Assessment   Cough variant asthma Onset around 2016 "when cat moved in" - FENO 12/17/2018  =   13 while on advair 250 bid  - 12/17/2018  After extensive coaching inhaler device,  effectiveness =    75% with hfa > try symb 80 2bid and add singulair daily   Chronically poorly controlled allergic rhinitis/ intol to nasal steroids, now evolving to asthma in setting of indoor cat exposure which she refuses to address "part of the family now"   Since she tol ICS so poorly and has such a low FEN) will try   low doses of symbicort and avoid dpi altogether as they can cause a cough that mimics asthma.    Rhinitis, chronic rec Trial of singulair 12/17/2018 / can't tol nasal steroids  Avoidance and nasal hygiene strongly encouraged      Total time devoted to counseling  > 50 % of initial 60 min office visit:  review case with pt/   device teaching which extended face to face time for this visit discussion of options/alternatives/ personally creating written customized instructions  in presence of pt  then going over those specific  Instructions directly with the pt including how to use all of the meds but in particular covering each new medication in detail and the difference between the maintenance=  "automatic" meds and the prns using an action plan format for the latter (If this problem/symptom => do that organization reading Left to right).  Please see AVS from this visit for a full list of these instructions which I personally wrote for this pt and  are unique to this visit.      Sandrea HughsMichael Analeese Andreatta, MD 12/17/2018

## 2018-12-17 NOTE — Patient Instructions (Addendum)
Singulair 10 mg one daily in pm   Plan A = Automatic = Symbicort 80 Take 2 puffs first thing in am and then another 2 puffs about 12 hours later.   Work on inhaler technique:  relax and gently blow all the way out then take a nice smooth deep breath back in, triggering the inhaler at same time you start breathing in.  Hold for up to 5 seconds if you can. Blow out thru nose. Rinse and gargle with water when done    Plan B = Backup Only use your albuterol inhaler as a rescue medication to be used if you can't catch your breath by resting or doing a relaxed purse lip breathing pattern.  - The less you use it, the better it will work when you need it. - Ok to use the inhaler up to 2 puffs  every 4 hours if you must but call for appointment if use goes up over your usual need - Don't leave home without it !!  (think of it like the spare tire for your car)    Please schedule a follow up office visit in 4 weeks, sooner if needed with pfts on return

## 2018-12-18 ENCOUNTER — Encounter: Payer: Self-pay | Admitting: Internal Medicine

## 2018-12-18 DIAGNOSIS — J310 Chronic rhinitis: Secondary | ICD-10-CM | POA: Insufficient documentation

## 2018-12-18 NOTE — Assessment & Plan Note (Signed)
Trial of singulair 12/17/2018 / can't tol nasal steroids  Avoidance and nasal hygiene strongly encouraged

## 2018-12-18 NOTE — Assessment & Plan Note (Signed)
Onset around 2016 "when cat moved in" - FENO 12/17/2018  =   13 while on advair 250 bid  - 12/17/2018  After extensive coaching inhaler device,  effectiveness =    75% with hfa > try symb 80 2bid and add singulair daily   Chronically poorly controlled allergic rhinitis/ intol to nasal steroids, now evolving to asthma in setting of indoor cat exposure which she refuses to address "part of the family now"   Since she tol ICS so poorly and has such a low FEN) will try   low doses of symbicort and avoid dpi altogether as they can cause a cough that mimics asthma.   Total time devoted to counseling  > 50 % of initial 60 min office visit:  review case with pt/   device teaching which extended face to face time for this visit discussion of options/alternatives/ personally creating written customized instructions  in presence of pt  then going over those specific  Instructions directly with the pt including how to use all of the meds but in particular covering each new medication in detail and the difference between the maintenance= "automatic" meds and the prns using an action plan format for the latter (If this problem/symptom => do that organization reading Left to right).  Please see AVS from this visit for a full list of these instructions which I personally wrote for this pt and  are unique to this visit.

## 2018-12-24 DIAGNOSIS — F4325 Adjustment disorder with mixed disturbance of emotions and conduct: Secondary | ICD-10-CM | POA: Diagnosis not present

## 2018-12-28 ENCOUNTER — Telehealth: Payer: Self-pay | Admitting: Internal Medicine

## 2018-12-28 ENCOUNTER — Other Ambulatory Visit: Payer: Self-pay

## 2018-12-28 ENCOUNTER — Emergency Department (HOSPITAL_COMMUNITY)
Admission: EM | Admit: 2018-12-28 | Discharge: 2018-12-28 | Disposition: A | Discharge status: Home / Self Care | Payer: BLUE CROSS/BLUE SHIELD | Attending: Emergency Medicine | Admitting: Emergency Medicine

## 2018-12-28 ENCOUNTER — Emergency Department (HOSPITAL_COMMUNITY): Payer: BLUE CROSS/BLUE SHIELD

## 2018-12-28 ENCOUNTER — Encounter (HOSPITAL_COMMUNITY): Payer: Self-pay | Admitting: Emergency Medicine

## 2018-12-28 DIAGNOSIS — I10 Essential (primary) hypertension: Secondary | ICD-10-CM | POA: Diagnosis not present

## 2018-12-28 DIAGNOSIS — Y999 Unspecified external cause status: Secondary | ICD-10-CM | POA: Diagnosis not present

## 2018-12-28 DIAGNOSIS — R062 Wheezing: Secondary | ICD-10-CM

## 2018-12-28 DIAGNOSIS — T17928A Food in respiratory tract, part unspecified causing other injury, initial encounter: Secondary | ICD-10-CM

## 2018-12-28 DIAGNOSIS — Y929 Unspecified place or not applicable: Secondary | ICD-10-CM | POA: Diagnosis not present

## 2018-12-28 DIAGNOSIS — T17820A Food in other parts of respiratory tract causing asphyxiation, initial encounter: Secondary | ICD-10-CM | POA: Diagnosis not present

## 2018-12-28 DIAGNOSIS — Z79899 Other long term (current) drug therapy: Secondary | ICD-10-CM | POA: Diagnosis not present

## 2018-12-28 DIAGNOSIS — T17920A Food in respiratory tract, part unspecified causing asphyxiation, initial encounter: Secondary | ICD-10-CM

## 2018-12-28 DIAGNOSIS — Y939 Activity, unspecified: Secondary | ICD-10-CM | POA: Insufficient documentation

## 2018-12-28 DIAGNOSIS — Z87891 Personal history of nicotine dependence: Secondary | ICD-10-CM | POA: Diagnosis not present

## 2018-12-28 DIAGNOSIS — X58XXXA Exposure to other specified factors, initial encounter: Secondary | ICD-10-CM | POA: Insufficient documentation

## 2018-12-28 DIAGNOSIS — R0602 Shortness of breath: Secondary | ICD-10-CM | POA: Diagnosis not present

## 2018-12-28 DIAGNOSIS — T17428A Food in trachea causing other injury, initial encounter: Secondary | ICD-10-CM | POA: Diagnosis not present

## 2018-12-28 MED ORDER — IPRATROPIUM-ALBUTEROL 0.5-2.5 (3) MG/3ML IN SOLN
3.0000 mL | Freq: Once | RESPIRATORY_TRACT | Status: AC
  Administered 2018-12-28: 3 mL via RESPIRATORY_TRACT
  Filled 2018-12-28: qty 3

## 2018-12-28 NOTE — ED Notes (Signed)
Pt given ice water w/ provider approval.

## 2018-12-28 NOTE — ED Notes (Signed)
Verbalized understanding discharge instructions and follow-up. In no acute distress.   

## 2018-12-28 NOTE — Telephone Encounter (Signed)
Nyoka Cowden, MD sent to Dione Booze, MD; Christen Butter, CMA        Will do - Muaad Boehning ok to add on to [facility] 12/30/18 schedule   Previous [facility] Encounter for Foreign Body; Shortness of Breath  12/28/2018   Visit Summary  Diagnoses    Wheezing (Primary)  Aspiration of food, initial encounter   LMTCB

## 2018-12-28 NOTE — Discharge Instructions (Addendum)
Use your albuterol inhaler aggressively for the next 1-2 days.  Return if you start running a fever, or if your breathing gets worse.

## 2018-12-28 NOTE — ED Notes (Signed)
Respiratory at bedside.

## 2018-12-28 NOTE — ED Provider Notes (Signed)
[facility]-EMERGENCY DEPT Provider Note   CSN: [id number] Arrival date & time: 12/28/18  0229    History   Chief Complaint Chief Complaint  Patient presents with  . Foreign Body  . Shortness of Breath    HPI Desiree Simpson is a 58 y.o. female.  The history is provided by the patient.  Foreign Body  Shortness of Breath  She has history of hypertension, attention deficit disorder, cough variant asthma and comes in believing that she had aspirated some rice at about 7 PM.  Since then, she has been coughing and felt short of breath and had wheezing.  She feels that it is on her right side.  She does have a rescue inhaler which she has not used.  Past Medical History:  Diagnosis Date  . ADD (attention deficit disorder)   . Anxiety   . Cat allergies   . Environmental allergies   . Hypertension   . Irritable bowel syndrome (IBS)     Patient Active Problem List   Diagnosis Date Noted  . Rhinitis, chronic 12/18/2018  . Cough variant asthma 12/17/2018    Past Surgical History:  Procedure Laterality Date  . SKIN TAG REMOVAL    . WISDOM TOOTH EXTRACTION       OB History   No obstetric history on file.      Home Medications    Prior to Admission medications   Medication Sig Start Date End Date Taking? Authorizing Provider  budesonide-formoterol (SYMBICORT) 80-4.5 MCG/ACT inhaler Inhale 2 puffs into the lungs 2 (two) times daily. 12/17/18   Nyoka Cowden, MD  busPIRone (BUSPAR) 30 MG tablet Take 30 mg by mouth 2 (two) times daily.    [provider]  citalopram (CELEXA) 40 MG tablet Take 40 mg by mouth daily.    [provider]  hydrochlorothiazide (HYDRODIURIL) 25 MG tablet Take 25 mg by mouth daily.    [provider]  irbesartan (AVAPRO) 150 MG tablet Take 150 mg by mouth daily.    [provider]  levocetirizine (XYZAL) 5 MG tablet Take 5 mg by mouth every evening.    [provider]    methylphenidate 36 MG PO CR tablet Take 36 mg by mouth daily.    [provider]  montelukast (SINGULAIR) 10 MG tablet Take 1 tablet (10 mg total) by mouth at bedtime. 12/17/18   Nyoka Cowden, MD  temazepam (RESTORIL) 7.5 MG capsule Take 7.5 mg by mouth at bedtime as needed for sleep.    [provider]  VENTOLIN HFA 108 (90 Base) MCG/ACT inhaler INL 2 PFS PO Q 4 TO 6 H PRF WHZ 10/18/15   [provider]    Family History Family History  Problem Relation Age of Onset  . Heart disease Mother   . Heart disease Father   . Heart disease Maternal Grandmother   . Heart disease Paternal Grandmother   . Heart disease Paternal Grandfather   . Colon cancer Neg Hx     Social History Social History   Tobacco Use  . Smoking status: Former Smoker    Packs/day: 1.00    Years: 15.00    Pack years: 15.00    Types: Cigarettes    Last attempt to quit: 11/04/1993    Years since quitting: 25.1  . Smokeless tobacco: Never Used  Substance Use Topics  . Alcohol use: Yes    Alcohol/week: 10.0 standard drinks    Types: 10 Glasses of wine  per week  . Drug use: No     Allergies   Cyclinex [tetracycline]   Review of Systems Review of Systems  Respiratory: Positive for shortness of breath.   All other systems reviewed and are negative.    Physical Exam Updated Vital Signs BP (!) 150/92 (BP Location: Left Arm)   Pulse (!) 104   Temp 99.6 F (37.6 C) (Oral)   Resp 20   Ht  (1.626 m)   Wt 95.3 kg   SpO2 94%   BMI 36.05 kg/m   Physical Exam Vitals signs and nursing note reviewed.    58 year old female, resting comfortably and in no acute distress. Vital signs are significant for elevated blood pressure and mildly elevated heart rate. Oxygen saturation is 94%, which is normal. Head is normocephalic and atraumatic. PERRLA, EOMI. Oropharynx is clear. Neck is nontender and supple without adenopathy or JVD. Back is nontender and there is no CVA  tenderness. Lungs have expiratory wheezes which are clearly worse on the right.  There are no rales or rhonchi. Chest is nontender. Heart has regular rate and rhythm without murmur. Abdomen is soft, flat, nontender without masses or hepatosplenomegaly and peristalsis is normoactive. Extremities have no cyanosis or edema, full range of motion is present. Skin is warm and dry without rash. Neurologic: Mental status is normal, cranial nerves are intact, there are no motor or sensory deficits.  ED Treatments / Results   Radiology Dg Chest 2 View  Result Date: 12/28/2018 CLINICAL DATA:  Increasing shortness of breath. Possible aspirated foreign body. EXAM: CHEST - 2 VIEW COMPARISON:  03/08/2012 FINDINGS: Normal heart size and pulmonary vascularity. No focal airspace disease or consolidation in the lungs. No blunting of costophrenic angles. No pneumothorax. Mediastinal contours appear intact. No radiopaque foreign bodies identified. Degenerative changes in the spine. IMPRESSION: No active cardiopulmonary disease. Electronically Signed   By: Burman Nieves M.D.   On: 12/28/2018 03:42    Procedures Procedures   Medications Ordered in ED Medications  ipratropium-albuterol (DUONEB) 0.5-2.5 (3) MG/3ML nebulizer solution 3 mL (3 mLs Nebulization Given 12/28/18 0336)  ipratropium-albuterol (DUONEB) 0.5-2.5 (3) MG/3ML nebulizer solution 3 mL (3 mLs Nebulization Given 12/28/18 0421)  ipratropium-albuterol (DUONEB) 0.5-2.5 (3) MG/3ML nebulizer solution 3 mL (3 mLs Nebulization Given 12/28/18 1610)     Initial Impression / Assessment and Plan / ED Course  I have reviewed the triage vital signs and the nursing notes.  Pertinent labs & imaging results that were available during my care of the patient were reviewed by me and considered in my medical decision making (see chart for details).  Cough and wheezing with possible aspiration of rice.  Will give albuterol with ipratropium via nebulizer and will  check chest x-ray.  Old records reviewed confirming recent pulmonary consultation with diagnosis of cough variant of asthma.  Chest x-ray shows no evidence of pneumonia or atelectasis.  Following nebulizer treatment, wheezing was slightly decreased but still considerably asymmetric.  She was given a second nebulizer treatment with further improvement.  Following third nebulizer treatment, there is minimal wheezing, and wheezing only present with somewhat forced exhalation.  Patient felt comfortable going home at this point.  She is advised to use her albuterol inhaler aggressively for the next 24-48hours.  Return precautions discussed.  She will need to follow-up with her pulmonologist for further evaluation.  I did recommend a course of prednisone, but patient declined stating that it affects her mood adversely.  Final Clinical Impressions(s) / ED  Diagnoses   Final diagnoses:  Wheezing  Aspiration of food, initial encounter    ED Discharge Orders    None       Dione Booze, MD 12/28/18 [phone]

## 2018-12-28 NOTE — ED Triage Notes (Signed)
Pt reports having increasing shortness and breath and feeling that rice is suck in throat that began around 1900.

## 2018-12-29 NOTE — Telephone Encounter (Signed)
appt for 2/267/2020 was scheduled

## 2018-12-30 ENCOUNTER — Ambulatory Visit (INDEPENDENT_AMBULATORY_CARE_PROVIDER_SITE_OTHER): Payer: BLUE CROSS/BLUE SHIELD | Admitting: Internal Medicine

## 2018-12-30 ENCOUNTER — Encounter: Payer: Self-pay | Admitting: Internal Medicine

## 2018-12-30 VITALS — BP 128/80 | HR 99 | Temp 98.8°F

## 2018-12-30 DIAGNOSIS — J310 Chronic rhinitis: Secondary | ICD-10-CM | POA: Diagnosis not present

## 2018-12-30 DIAGNOSIS — J45991 Cough variant asthma: Secondary | ICD-10-CM

## 2018-12-30 MED ORDER — METHYLPREDNISOLONE ACETATE 80 MG/ML IJ SUSP
120.0000 mg | Freq: Once | INTRAMUSCULAR | Status: AC
  Administered 2018-12-30: 120 mg via INTRAMUSCULAR

## 2018-12-30 NOTE — Assessment & Plan Note (Addendum)
Onset around 2016 "when cat moved in" - FENO 12/17/2018  =   13 while on advair 250 bid  - 12/17/2018    try symb 80 2bid   - 12/30/2018  After extensive coaching inhaler device,  effectiveness =    75% so continue symb 80 and add singulair 10 mg daily   Prior to asp of peanut  (? If that is really all? )>>  All goals of chronic asthma control met including optimal function and elimination of symptoms with minimal need for rescue therapy.  Contingencies discussed in full including contacting this office immediately if not controlling the symptoms using the rule of two's.     Should be able to re-establish control unless other food aspirated and if we can get her to stop with the violent coughing fits so rec depomedrol 120 mg IM,  Add singulair, keep symb dose on low side so as not to aggravate the cough, and add max non-medicine rx for gerd most importantly to include bed blocks and reduce alcohol intake

## 2018-12-30 NOTE — Assessment & Plan Note (Addendum)
Trial of singulair 12/30/2018  -  can't tol nasal steroids  I had an extended discussion with the patient reviewing all relevant studies completed to date and  lasting 15 to 20 minutes of a 25 minute acute post er f/u office visit  Re new complication (FB aspiration)   See device teaching which extended face to face time for this visit.  Each maintenance medication was reviewed in detail including emphasizing most importantly the difference between maintenance and prns and under what circumstances the prns are to be triggered using an action plan format that is not reflected in the computer generated alphabetically organized AVS which I have not found useful in most complex patients, especially with respiratory illnesses  Please see AVS for specific instructions unique to this visit that I personally wrote and verbalized to the the pt in detail and then reviewed with pt  by my nurse highlighting any  changes in therapy recommended at today's visit to their plan of care.

## 2018-12-30 NOTE — Patient Instructions (Signed)
Work on inhaler technique:  relax and gently blow all the way out then take a nice smooth deep breath back in, triggering the inhaler at same time you start breathing in.  Hold for up to 5 seconds if you can. Blow out thru nose. Rinse and gargle with water when done     Add singulair 10 mg in evening   Only use your albuterol (ventolin) as a rescue medication to be used if you can't catch your breath by resting or doing a relaxed purse lip breathing pattern.  - The less you use it, the better it will work when you need it. - Ok to use up to 2 puffs  every 4 hours if you must but call for immediate appointment if use goes up over your usual need - Don't leave home without it !!  (think of it like the spare tire for your car)    GERD (REFLUX)  is an extremely common cause of respiratory symptoms just like yours , many times with no obvious heartburn at all.    It can be treated with medication, but also with lifestyle changes including elevation of the head of your bed (ideally with 6 -8inch blocks under the headboard of your bed),  Smoking cessation, avoidance of late meals, excessive alcohol, and avoid fatty foods, chocolate, peppermint, colas, red wine, and acidic juices such as orange juice.  NO MINT OR MENTHOL PRODUCTS SO NO COUGH DROPS  USE SUGARLESS CANDY INSTEAD (Jolley ranchers or Stover's or Life Savers) or even ice chips will also do - the key is to swallow to prevent all throat clearing. NO OIL BASED VITAMINS - use powdered substitutes.  Avoid fish oil when coughing.   Keep your original appt

## 2018-12-30 NOTE — Progress Notes (Signed)
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 Pulmonary Consult    Referred by Merri Brunette. Pt states dxed with Asthma approx 2 years ago. She c/o having a cold for the past few wks- cough with green sputum and wheezing. She is using her albuterol inhaler about 2 x per day.   Dyspnea:  Baseline able to Heavy house work / up and down steps / worse with cat exp Cough: worse x sev weeks/rx mucinex/so restarted advair 10 days and not really better up to twice daily with minimal sputum  Sleep: some sense of drainage disturbs sleep  SABA use: last 9 pm prior to OV   rec Singulair 10 mg one daily in pm  Plan A = Automatic = Symbicort 80 Take 2 puffs first thing in am and then another 2 puffs about 12 hours later.  Work on inhaler technique:   Plan B = Backup Only use your albuterol inhaler Please schedule a follow up office visit in 4 weeks, sooner if needed with pfts on return    12/30/2018  f/u ov/Desiree Simpson re:  Cough variant asthma  Chief Complaint  Patient presents with  . Follow-up    Pt states aspirated a peanut 12/23/2018- she was just able to cough this up this morning- breathing has improved since then. She use using her albuterol inhaler 2 x daily.   Dyspnea:  Prior to asp doing much better despite poor hfa on symb 80 2bid and hand not yet started  singulair as rec  Cough: was better until aspiration > then severe hacking with assoc wheezing / traces of brb Sleeping: 2 pillows flat bed  Was fine until asp ? Related to drinking wine / now having to sit more upright  SABA use: not needed until aspiration  02: none   No obvious day to day or daytime variability or assoc excess/ purulent sputum or mucus plugs    or cp or chest tightness, or overt sinus or hb symptoms.   Sleeping had previously improved on 2 pillows  without nocturnal  or early am exacerbation  of respiratory  c/o's or need for noct saba. Also denies any obvious fluctuation of symptoms with weather or environmental changes or other aggravating or alleviating factors except as outlined above   No unusual exposure hx or h/o childhood pna/ asthma or knowledge of premature birth.  Current Allergies, Complete Past Medical History, Past Surgical History, Family History, and Social History were reviewed in [employer] record.  ROS  The following are not active complaints unless bolded Hoarseness, sore throat, dysphagia, dental problems, itching, sneezing,  nasal congestion or discharge of excess mucus or purulent secretions, ear ache,   fever, chills, sweats, unintended wt loss or wt gain, classically pleuritic or exertional  cp,  orthopnea pnd or arm/hand swelling  or leg swelling, presyncope, palpitations, abdominal pain, anorexia, nausea, vomiting, diarrhea  or change in bowel habits or change in bladder habits, change in stools or change in urine, dysuria, hematuria,  rash, arthralgias, visual complaints, headache, numbness, weakness or ataxia or problems with walking or coordination,  change in mood= anxiety  or  memory.        Current Meds  Medication Sig  . budesonide-formoterol (SYMBICORT) 80-4.5 MCG/ACT inhaler Inhale 2 puffs into the lungs 2 (two) times daily.  . busPIRone (BUSPAR) 30 MG tablet Take 30 mg by mouth 2 (two) times daily.  . citalopram  (CELEXA) 40 MG tablet Take 40 mg by mouth daily.  . hydrochlorothiazide (HYDRODIURIL) 25 MG tablet Take 25 mg by mouth daily.  . irbesartan (AVAPRO) 150 MG tablet Take 150 mg by mouth daily.  Marland Kitchen levocetirizine (XYZAL) 5 MG tablet Take 5 mg by mouth every evening.  . methylphenidate 36 MG PO CR tablet Take 36 mg by mouth daily.  . montelukast (SINGULAIR) 10 MG tablet Take 1 tablet (10 mg total) by mouth at bedtime.  . temazepam (RESTORIL) 7.5 MG capsule Take 15 mg by mouth at bedtime as needed for sleep.   . VENTOLIN HFA 108 (90 Base) MCG/ACT inhaler Inhale 1-2 puffs into the lungs every 6 (six) hours as needed for wheezing or shortness of breath.            Objective:    amb wf nad but nasal tone to voice and  harsh barking dry sounding cough    Wt Readings from Last 3 Encounters:  12/28/18 210 lb (95.3 kg)  11/29/15 215 lb (97.5 kg)     Vital signs reviewed - Note on arrival 02 sats  96% on RA         HEENT: nl dentition, turbinates bilaterally, and oropharynx. Nl external ear canals without cough reflex   NECK :  without JVD/Nodes/TM/ nl carotid upstrokes bilaterally   LUNGS: no acc muscle use,  Nl contour chest with transmitted upper airway "wheeze" also heard distally with both insp/exp rhonchi  CV:  RRR  no s3 or murmur or increase in P2, and no edema   ABD:  soft and nontender with nl inspiratory excursion in the supine position. No bruits or organomegaly appreciated, bowel sounds nl  MS:  Nl gait/ ext warm without deformities, calf tenderness, cyanosis or clubbing No obvious joint restrictions   SKIN: warm and dry without lesions    NEURO:  alert, approp, nl sensorium with  no motor or cerebellar deficits apparent.       I personally reviewed images and agree with radiology impression as follows:  CXR:    12/28/18  No active cardiopulmonary disease.    Assessment

## 2019-01-04 DIAGNOSIS — F411 Generalized anxiety disorder: Secondary | ICD-10-CM | POA: Diagnosis not present

## 2019-01-04 DIAGNOSIS — F900 Attention-deficit hyperactivity disorder, predominantly inattentive type: Secondary | ICD-10-CM | POA: Diagnosis not present

## 2019-01-07 DIAGNOSIS — F4325 Adjustment disorder with mixed disturbance of emotions and conduct: Secondary | ICD-10-CM | POA: Diagnosis not present

## 2019-01-15 ENCOUNTER — Ambulatory Visit (INDEPENDENT_AMBULATORY_CARE_PROVIDER_SITE_OTHER): Payer: BLUE CROSS/BLUE SHIELD | Admitting: Internal Medicine

## 2019-01-15 ENCOUNTER — Other Ambulatory Visit: Payer: Self-pay

## 2019-01-15 ENCOUNTER — Encounter: Payer: Self-pay | Admitting: Nurse Practitioner

## 2019-01-15 ENCOUNTER — Ambulatory Visit (INDEPENDENT_AMBULATORY_CARE_PROVIDER_SITE_OTHER): Payer: BLUE CROSS/BLUE SHIELD | Admitting: Nurse Practitioner

## 2019-01-15 VITALS — BP 130/82 | HR 87 | Ht 64.25 in | Wt 214.0 lb

## 2019-01-15 DIAGNOSIS — J45991 Cough variant asthma: Secondary | ICD-10-CM

## 2019-01-15 LAB — PULMONARY FUNCTION TEST
DL/VA % PRED: 113 %
DL/VA: 4.8 ml/min/mmHg/L
DLCO unc % pred: 107 %
DLCO unc: 22.12 ml/min/mmHg
FEF 25-75 Post: 2.62 L/sec
FEF 25-75 Pre: 2.16 L/sec
FEF2575-%Change-Post: 21 %
FEF2575-%Pred-Post: 105 %
FEF2575-%Pred-Pre: 86 %
FEV1-%Change-Post: 4 %
FEV1-%Pred-Post: 94 %
FEV1-%Pred-Pre: 89 %
FEV1-Post: 2.5 L
FEV1-Pre: 2.38 L
FEV1FVC-%Change-Post: 0 %
FEV1FVC-%Pred-Pre: 102 %
FEV6-%Change-Post: 7 %
FEV6-%Pred-Post: 94 %
FEV6-%Pred-Pre: 88 %
FEV6-POST: 3.13 L
FEV6-Pre: 2.91 L
FEV6FVC-%Change-Post: 1 %
FEV6FVC-%PRED-POST: 103 %
FEV6FVC-%Pred-Pre: 101 %
FVC-%Change-Post: 5 %
FVC-%Pred-Post: 91 %
FVC-%Pred-Pre: 86 %
FVC-Post: 3.13 L
FVC-Pre: 2.96 L
PRE FEV6/FVC RATIO: 99 %
Post FEV1/FVC ratio: 80 %
Post FEV6/FVC ratio: 100 %
Pre FEV1/FVC ratio: 81 %
RV % pred: 107 %
RV: 2.09 L
TLC % pred: 102 %
TLC: 5.21 L

## 2019-01-15 MED ORDER — BUDESONIDE-FORMOTEROL FUMARATE 80-4.5 MCG/ACT IN AERO: 2.0000 | INHALATION_SPRAY | Freq: Two times a day (BID) | RESPIRATORY_TRACT | 0 refills | Status: DC

## 2019-01-15 NOTE — Patient Instructions (Addendum)
Continue Symbicort - sample given in office today  Work on inhaler technique:  relax and gently blow all the way out then take a nice smooth deep breath back in, triggering the inhaler at same time you start breathing in.  Hold for up to 5 seconds if you can. Blow out thru nose. Rinse and gargle with water when done  Only use your albuterol (ventolin) as a rescue medication to be used if you can't catch your breath by resting or doing a relaxed purse lip breathing pattern.  - The less you use it, the better it will work when you need it. - Ok to use up to 2 puffs  every 4 hours if you must but call for immediate appointment if use goes up over your usual need - Don't leave home without it !!  (think of it like the spare tire for your car)   GERD (REFLUX)  is an extremely common cause of respiratory symptoms just like yours , many times with no obvious heartburn at all.    It can be treated with medication, but also with lifestyle changes including elevation of the head of your bed (ideally with 6 -8inch blocks under the headboard of your bed),  Smoking cessation, avoidance of late meals, excessive alcohol, and avoid fatty foods, chocolate, peppermint, colas, red wine, and acidic juices such as orange juice.  NO MINT OR MENTHOL PRODUCTS SO NO COUGH DROPS  USE SUGARLESS CANDY INSTEAD (Jolley ranchers or Stover's or Life Savers) or even ice chips will also do - the key is to swallow to prevent all throat clearing. NO OIL BASED VITAMINS - use powdered substitutes.  Avoid fish oil when coughing.   Follow up with Dr. Sherene Sires in 3 months

## 2019-01-15 NOTE — Assessment & Plan Note (Addendum)
Presents today for routine follow-up with PFT.  PFT today was normal.  States that she did not use any inhalers before her test this morning.  She was last seen by Dr. Sherene Sires on 12/17/2018 for cough variant asthma.  Was started on Symbicort and Singulair.  She states that she did not take the Singulair because she was worried about side effects.    Patient Instructions  Continue Symbicort - sample given in office today  Work on inhaler technique:  relax and gently blow all the way out then take a nice smooth deep breath back in, triggering the inhaler at same time you start breathing in.  Hold for up to 5 seconds if you can. Blow out thru nose. Rinse and gargle with water when done  Only use your albuterol (ventolin) as a rescue medication to be used if you can't catch your breath by resting or doing a relaxed purse lip breathing pattern.  - The less you use it, the better it will work when you need it. - Ok to use up to 2 puffs  every 4 hours if you must but call for immediate appointment if use goes up over your usual need - Don't leave home without it !!  (think of it like the spare tire for your car)   GERD (REFLUX)  is an extremely common cause of respiratory symptoms just like yours , many times with no obvious heartburn at all.    It can be treated with medication, but also with lifestyle changes including elevation of the head of your bed (ideally with 6 -8inch blocks under the headboard of your bed),  Smoking cessation, avoidance of late meals, excessive alcohol, and avoid fatty foods, chocolate, peppermint, colas, red wine, and acidic juices such as orange juice.  NO MINT OR MENTHOL PRODUCTS SO NO COUGH DROPS  USE SUGARLESS CANDY INSTEAD (Jolley ranchers or Stover's or Life Savers) or even ice chips will also do - the key is to swallow to prevent all throat clearing. NO OIL BASED VITAMINS - use powdered substitutes.  Avoid fish oil when coughing.   Follow up with Dr. Sherene Sires in 3  months

## 2019-01-15 NOTE — Progress Notes (Signed)
  ID: Braulio Bosch, female    DOB: 10/04/1961, 58 y.o.   MRN: [id number]  Chief Complaint  Patient presents with  . 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  Onset around 2016 "when cat moved in" - FENO 12/17/2018  =   13 while on advair 250 bid  - 12/17/2018    try symb 80 2bid   - 12/30/2018  After extensive coaching inhaler device,  effectiveness =    75% so continue symb 80 and add singulair 10 mg daily  Trial of singulair 12/30/2018  -  can't tol nasal steroids  Last office visit with Dr. Sherene Sires 12/30/18 Work on inhaler technique:  relax and gently blow all the way out then take a nice smooth deep breath back in, triggering the inhaler at same time you start breathing in.  Hold for up to 5 seconds if you can. Blow out thru nose. Rinse and gargle with water when done (taking Symbicort - much improved)  Add singulair 10 mg in evening (did not take)  Only use your albuterol (ventolin) as a rescue medication to be used if you can't catch your breath by resting or doing a relaxed purse lip breathing pattern.  - The less you use it, the better it will work when you need it. - Ok to use up to 2 puffs  every 4 hours if you must but call for immediate appointment if use goes up over your usual need - Don't leave home without it !!  (think of it like the spare tire for your car)   GERD (REFLUX)  is an extremely common  cause of respiratory symptoms just like yours , many times with no obvious heartburn at all.    It can be treated with medication, but also with lifestyle changes including elevation of the head of your bed (ideally with 6 -8inch blocks under the headboard of your bed),  Smoking cessation, avoidance of late meals, excessive alcohol, and avoid fatty foods, chocolate, peppermint, colas, red wine, and acidic juices such as orange juice.  NO MINT OR MENTHOL PRODUCTS SO NO COUGH DROPS  USE SUGARLESS CANDY INSTEAD (Jolley ranchers or Stover's or Life Savers) or even ice chips will also do - the key is to swallow to prevent all throat clearing. NO OIL BASED VITAMINS - use powdered substitutes.  Avoid fish oil when coughing.   OV 01/15/19 - follow up with PFT Presents today for routine follow-up with PFT.  PFT today was normal.  States that she did not use any inhalers before her test this morning.  She was last seen by Dr. Sherene Sires on 12/17/2018 for cough variant asthma.  Was started on Symbicort and Singulair.  She states that she did not take the Singulair because she was worried about side effects.  She has been taking the Symbicort as directed and feels  much improved. Denies f/c/s, n/v/d, hemoptysis, PND, leg swelling.     Allergies  Allergen Reactions  . Cyclinex [Tetracycline] Rash    cycline family    Immunization History  Administered Date(s) Administered  . Influenza,inj,Quad PF,6+ Mos 09/27/2018    Past Medical History:  Diagnosis Date  . ADD (attention deficit disorder)   . Anxiety   . Cat allergies   . Environmental allergies   . Hypertension   . Irritable bowel syndrome (IBS)     Tobacco History: Social History   Tobacco Use  Smoking Status Former Smoker  . Packs/day: 1.00  . Years: 15.00  . Pack years: 15.00  . Types: Cigarettes  . Last attempt to quit: 11/04/1993  . Years since quitting: 25.2  Smokeless Tobacco Never Used   Counseling given: Yes   Outpatient  Encounter Medications as of 01/15/2019  Medication Sig  . budesonide-formoterol (SYMBICORT) 80-4.5 MCG/ACT inhaler Inhale 2 puffs into the lungs 2 (two) times daily.  . busPIRone (BUSPAR) 30 MG tablet Take 30 mg by mouth 2 (two) times daily.  . citalopram (CELEXA) 40 MG tablet Take 40 mg by mouth daily.  . hydrochlorothiazide (HYDRODIURIL) 25 MG tablet Take 25 mg by mouth daily.  . irbesartan (AVAPRO) 150 MG tablet Take 150 mg by mouth daily.  Marland Kitchen levocetirizine (XYZAL) 5 MG tablet Take 5 mg by mouth every evening.  . methylphenidate 36 MG PO CR tablet Take 36 mg by mouth daily.  . montelukast (SINGULAIR) 10 MG tablet Take 1 tablet (10 mg total) by mouth at bedtime.  . temazepam (RESTORIL) 7.5 MG capsule Take 15 mg by mouth at bedtime as needed for sleep.   . VENTOLIN HFA 108 (90 Base) MCG/ACT inhaler Inhale 1-2 puffs into the lungs every 6 (six) hours as needed for wheezing or shortness of breath.   . budesonide-formoterol (SYMBICORT) 80-4.5 MCG/ACT inhaler Inhale 2 puffs into the lungs 2 (two) times daily.   No facility-administered encounter medications on file as of 01/15/2019.      Review of Systems  Review of Systems  Constitutional: Negative.  Negative for chills and fever.  HENT: Negative.   Respiratory: Negative for cough, shortness of breath and wheezing.   Cardiovascular: Negative.   Gastrointestinal: Negative.   Allergic/Immunologic: Negative.   Neurological: Negative.   Psychiatric/Behavioral: Negative.        Physical Exam  BP 130/82 (BP Location: Right Arm, Patient Position: Sitting, Cuff Size: Large)   Pulse 87   Ht 5' 4.25" (1.632 m)   Wt 214 lb (97.1 kg)   SpO2 100%   BMI 36.45 kg/m   Wt Readings from Last 5 Encounters:  01/15/19 214 lb (97.1 kg)  12/28/18 210 lb (95.3 kg)  11/29/15 215 lb (97.5 kg)     Physical Exam Vitals signs and nursing note reviewed.  Constitutional:      General: She is not in acute distress.    Appearance: She is  well-developed.  Cardiovascular:     Rate and Rhythm: Normal rate and regular rhythm.  Pulmonary:     Effort: Pulmonary effort is normal. No respiratory distress.     Breath sounds: Normal breath sounds. No wheezing or rhonchi.  Neurological:     Mental Status: She is alert and oriented to person, place, and time.     Imaging: Dg Chest 2 View  Result Date: 12/28/2018 CLINICAL DATA:  Increasing shortness of breath. Possible aspirated foreign body. EXAM: CHEST - 2 VIEW COMPARISON:  03/08/2012 FINDINGS: Normal  heart size and pulmonary vascularity. No focal airspace disease or consolidation in the lungs. No blunting of costophrenic angles. No pneumothorax. Mediastinal contours appear intact. No radiopaque foreign bodies identified. Degenerative changes in the spine. IMPRESSION: No active cardiopulmonary disease. Electronically Signed   By: Burman Nieves M.D.   On: 12/28/2018 03:42     Assessment & Plan:   Cough variant asthma Presents today for routine follow-up with PFT.  PFT today was normal.  States that she did not use any inhalers before her test this morning.  She was last seen by Dr. Sherene Sires on 12/17/2018 for cough variant asthma.  Was started on Symbicort and Singulair.  She states that she did not take the Singulair because she was worried about side effects.    Patient Instructions  Continue Symbicort - sample given in office today  Work on inhaler technique:  relax and gently blow all the way out then take a nice smooth deep breath back in, triggering the inhaler at same time you start breathing in.  Hold for up to 5 seconds if you can. Blow out thru nose. Rinse and gargle with water when done  Only use your albuterol (ventolin) as a rescue medication to be used if you can't catch your breath by resting or doing a relaxed purse lip breathing pattern.  - The less you use it, the better it will work when you need it. - Ok to use up to 2 puffs  every 4 hours if you must but call for  immediate appointment if use goes up over your usual need - Don't leave home without it !!  (think of it like the spare tire for your car)   GERD (REFLUX)  is an extremely common cause of respiratory symptoms just like yours , many times with no obvious heartburn at all.    It can be treated with medication, but also with lifestyle changes including elevation of the head of your bed (ideally with 6 -8inch blocks under the headboard of your bed),  Smoking cessation, avoidance of late meals, excessive alcohol, and avoid fatty foods, chocolate, peppermint, colas, red wine, and acidic juices such as orange juice.  NO MINT OR MENTHOL PRODUCTS SO NO COUGH DROPS  USE SUGARLESS CANDY INSTEAD (Jolley ranchers or Stover's or Life Savers) or even ice chips will also do - the key is to swallow to prevent all throat clearing. NO OIL BASED VITAMINS - use powdered substitutes.  Avoid fish oil when coughing.   Follow up with Dr. Sherene Sires in 3 months       Ivonne Andrew, NP 01/15/2019

## 2019-01-15 NOTE — Progress Notes (Signed)
PFT done today. 

## 2019-01-20 ENCOUNTER — Encounter: Payer: Self-pay | Admitting: Internal Medicine

## 2019-01-21 NOTE — Progress Notes (Signed)
Chart and office note reviewed in detail  > agree with a/p as outlined    

## 2019-01-26 NOTE — Progress Notes (Signed)
ATC, NA and no VM  Will close per protocol

## 2019-04-19 ENCOUNTER — Other Ambulatory Visit: Payer: Self-pay

## 2019-04-19 ENCOUNTER — Encounter: Payer: Self-pay | Admitting: Internal Medicine

## 2019-04-19 ENCOUNTER — Ambulatory Visit (INDEPENDENT_AMBULATORY_CARE_PROVIDER_SITE_OTHER): Payer: BC Managed Care – PPO | Admitting: Internal Medicine

## 2019-04-19 DIAGNOSIS — J310 Chronic rhinitis: Secondary | ICD-10-CM | POA: Diagnosis not present

## 2019-04-19 DIAGNOSIS — J45991 Cough variant asthma: Secondary | ICD-10-CM | POA: Diagnosis not present

## 2019-04-19 MED ORDER — BUDESONIDE-FORMOTEROL FUMARATE 80-4.5 MCG/ACT IN AERO: 2.0000 | INHALATION_SPRAY | Freq: Two times a day (BID) | RESPIRATORY_TRACT | 0 refills | Status: DC

## 2019-04-19 NOTE — Progress Notes (Signed)
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 Pulmonary Consult    Referred by Desiree Simpson. Pt states dxed with Asthma approx 2 years ago. She c/o having a cold for the past few wks- cough with green sputum and wheezing. She is using her albuterol inhaler about 2 x per day.   Dyspnea:  Baseline able to Heavy house work / up and down steps / worse with cat exp Cough: worse x sev weeks/rx mucinex/so restarted advair 10 days and not really better up to twice daily with minimal sputum  Sleep: some sense of drainage disturbs sleep  SABA use: last 9 pm prior to OV   rec Singulair 10 mg one daily in pm  Plan A = Automatic = Symbicort 80 Take 2 puffs first thing in am and then another 2 puffs about 12 hours later.  Work on inhaler technique:   Plan B = Backup Only use your albuterol inhaler Please schedule a follow up office visit in 4 weeks, sooner if needed with pfts on return    12/30/2018  f/u ov/Desiree Simpson re:  Cough variant asthma  Chief Complaint  Patient presents with  . Follow-up    Pt states aspirated a peanut 12/23/2018- she was just able to cough this up this morning- breathing has improved since then. She use using her albuterol inhaler 2 x daily.   Dyspnea:  Prior to asp doing much better despite poor hfa on symb 80 2bid and had not yet started singulair  as rec  Cough: was better until aspiration > then severe hacking with assoc wheezing / traces of brb Sleeping: 2 pillows flat bed  Was fine until asp ? Related to drinking wine / now having to sit more upright  SABA use: not needed until aspiration  rec Work on inhaler technique:    Add singulair 10 mg in evening  Only use your albuterol (ventolin) as a rescue medication GERD diet   04/19/2019  f/u ov/Desiree Simpson re: cough variant asthma > never started singulair  Chief Complaint  Patient presents with  . Follow-up    Breathing is overall doing well. She uses her ventolin once per wk on average.   Dyspnea:  Not limited by sob, steps  Cough:only with cat or obvious dust esp   Sleeping: flat bed, 2 pillows/ gerd better on diet  SABA use: once a week at most  02: none    No obvious day to day or daytime variability or assoc excess/ purulent sputum or mucus plugs or hemoptysis or cp or chest tightness, subjective wheeze or overt  hb symptoms.   Sleeping as above  without nocturnal  or early am exacerbation  of respiratory  c/o's or need for noct saba. Also denies any obvious fluctuation of symptoms  with weather or environmental changes or other aggravating or alleviating factors except as outlined above   No unusual exposure hx or h/o childhood pna/ asthma or knowledge of premature birth.  Current Allergies, Complete Past Medical History, Past Surgical History, Family History, and Social History were reviewed in [employer] record.  ROS  The following are not active complaints unless bolded Hoarseness, sore throat, dysphagia, dental problems, itching, sneezing,  nasal congestion or discharge of excess mucus or purulent secretions, ear ache,   fever, chills, sweats, unintended wt loss or wt gain, classically pleuritic or exertional cp,  orthopnea pnd or arm/hand swelling  or leg swelling, presyncope, palpitations, abdominal pain, anorexia, nausea, vomiting, diarrhea  or  change in bowel habits or change in bladder habits, change in stools or change in urine, dysuria, hematuria,  rash, arthralgias, visual complaints, headache, numbness, weakness or ataxia or problems with walking or coordination,  change in mood or  memory.        Current Meds  Medication Sig  . budesonide-formoterol (SYMBICORT) 80-4.5 MCG/ACT inhaler Inhale 2 puffs into the lungs 2 (two) times daily.  . busPIRone (BUSPAR) 30 MG tablet Take 30 mg by mouth 2 (two) times daily.  . citalopram (CELEXA) 40 MG tablet Take 40 mg by mouth daily.  . hydrochlorothiazide (HYDRODIURIL) 25 MG tablet Take 25 mg by mouth daily.  . irbesartan (AVAPRO) 150 MG tablet Take 150 mg by mouth daily.  Marland Kitchen levocetirizine (XYZAL) 5 MG tablet Take 5 mg by mouth every evening.  . methylphenidate 36 MG PO CR tablet Take 36 mg by mouth daily.  . temazepam (RESTORIL) 7.5 MG capsule Take 15 mg by mouth at bedtime as needed for sleep.   . VENTOLIN HFA 108 (90 Base) MCG/ACT inhaler Inhale 1-2 puffs into the lungs every 6 (six) hours as needed for wheezing or shortness of breath.                     Objective:     amb mod obese wf nad    04/19/2019       210   12/28/18 210 lb (95.3 kg)  11/29/15 215 lb (97.5 kg)    Vital signs reviewed - Note on arrival 02 sats  98% on RA      HEENT: nl dentition, turbinates bilaterally, and oropharynx. Nl external ear canals without cough reflex   NECK :  without JVD/Nodes/TM/ nl carotid upstrokes bilaterally   LUNGS: no acc muscle use,  Nl contour chest which is clear to A and P bilaterally without cough on insp or exp maneuvers   CV:  RRR  no s3 or murmur or increase in P2, and no edema   ABD:  soft and nontender with nl inspiratory excursion in the supine position. No bruits or organomegaly appreciated, bowel sounds nl  MS:  Nl gait/ ext warm without deformities, calf tenderness, cyanosis or clubbing No obvious joint restrictions   SKIN: warm and dry without lesions     NEURO:  alert, approp, nl sensorium with  no motor or cerebellar deficits apparent.      Assessment

## 2019-04-19 NOTE — Assessment & Plan Note (Addendum)
Onset around 2016 "when cat moved in" - FENO 12/17/2018  =   13 while on advair 250 bid  - 12/17/2018    try symb 80 2bid   - 12/30/2018  continue symb 80 and add singulair 10 mg daily > never started (boxed warning re mood changes)  - PFT's  01/15/2019  FEV1 2.50 (94 % ) ratio 0.80  p 4 % improvement from saba p nothing  prior to study with DLCO  107 % corrects to 113 % for alv volume  With no curvature to f/v   - 04/19/2019  After extensive coaching inhaler device,  effectiveness =   75%> continue symb 80 2bid and consider singulair x 4 week challeng prn  Despite suboptimal hfa >  All goals of chronic asthma control met including optimal function and elimination of symptoms with minimal need for rescue therapy.  Contingencies discussed in full including contacting this office immediately if not controlling the symptoms using the rule of two's.     F/u can be q 12 m, sooner prn    I had an extended discussion with the patient reviewing all relevant studies completed to date and  lasting 15 to 20 minutes of a 25 minute visit    See device teaching which extended face to face time for this visit.  Each maintenance medication was reviewed in detail including emphasizing most importantly the difference between maintenance and prns and under what circumstances the prns are to be triggered using an action plan format that is not reflected in the computer generated alphabetically organized AVS which I have not found useful in most complex patients, especially with respiratory illnesses  Please see AVS for specific instructions unique to this visit that I personally wrote and verbalized to the the pt in detail and then reviewed with pt  by my nurse highlighting any  changes in therapy recommended at today's visit to their plan of care.

## 2019-04-19 NOTE — Assessment & Plan Note (Signed)
Trial of singulair 12/30/2018 / can't tol nasal steroids> never took singulair   Can add back singulair s ov if nasal flares

## 2019-04-19 NOTE — Patient Instructions (Signed)
Work on inhaler technique:  relax and gently blow all the way out then take a nice smooth deep breath back in, triggering the inhaler at same time you start breathing in.  Hold for up to 5 seconds if you can. Blow out thru nose. Rinse and gargle with water when done     If still having symptoms especially if nasal in origin > add singulair x one month trial    Please schedule a follow up visit in 12 months but call sooner if needed

## 2019-07-05 DIAGNOSIS — F411 Generalized anxiety disorder: Secondary | ICD-10-CM | POA: Diagnosis not present

## 2019-07-05 DIAGNOSIS — F3342 Major depressive disorder, recurrent, in full remission: Secondary | ICD-10-CM | POA: Diagnosis not present

## 2019-07-05 DIAGNOSIS — F900 Attention-deficit hyperactivity disorder, predominantly inattentive type: Secondary | ICD-10-CM | POA: Diagnosis not present

## 2019-08-26 IMAGING — CR DG CHEST 2V
2 series · 2 of 2 positions shown · non-contrast
Comparison: 03/08/2012

CLINICAL DATA: Increasing shortness of breath. Possible aspirated
foreign body.

EXAM:
CHEST - 2 VIEW

[w chest pa]
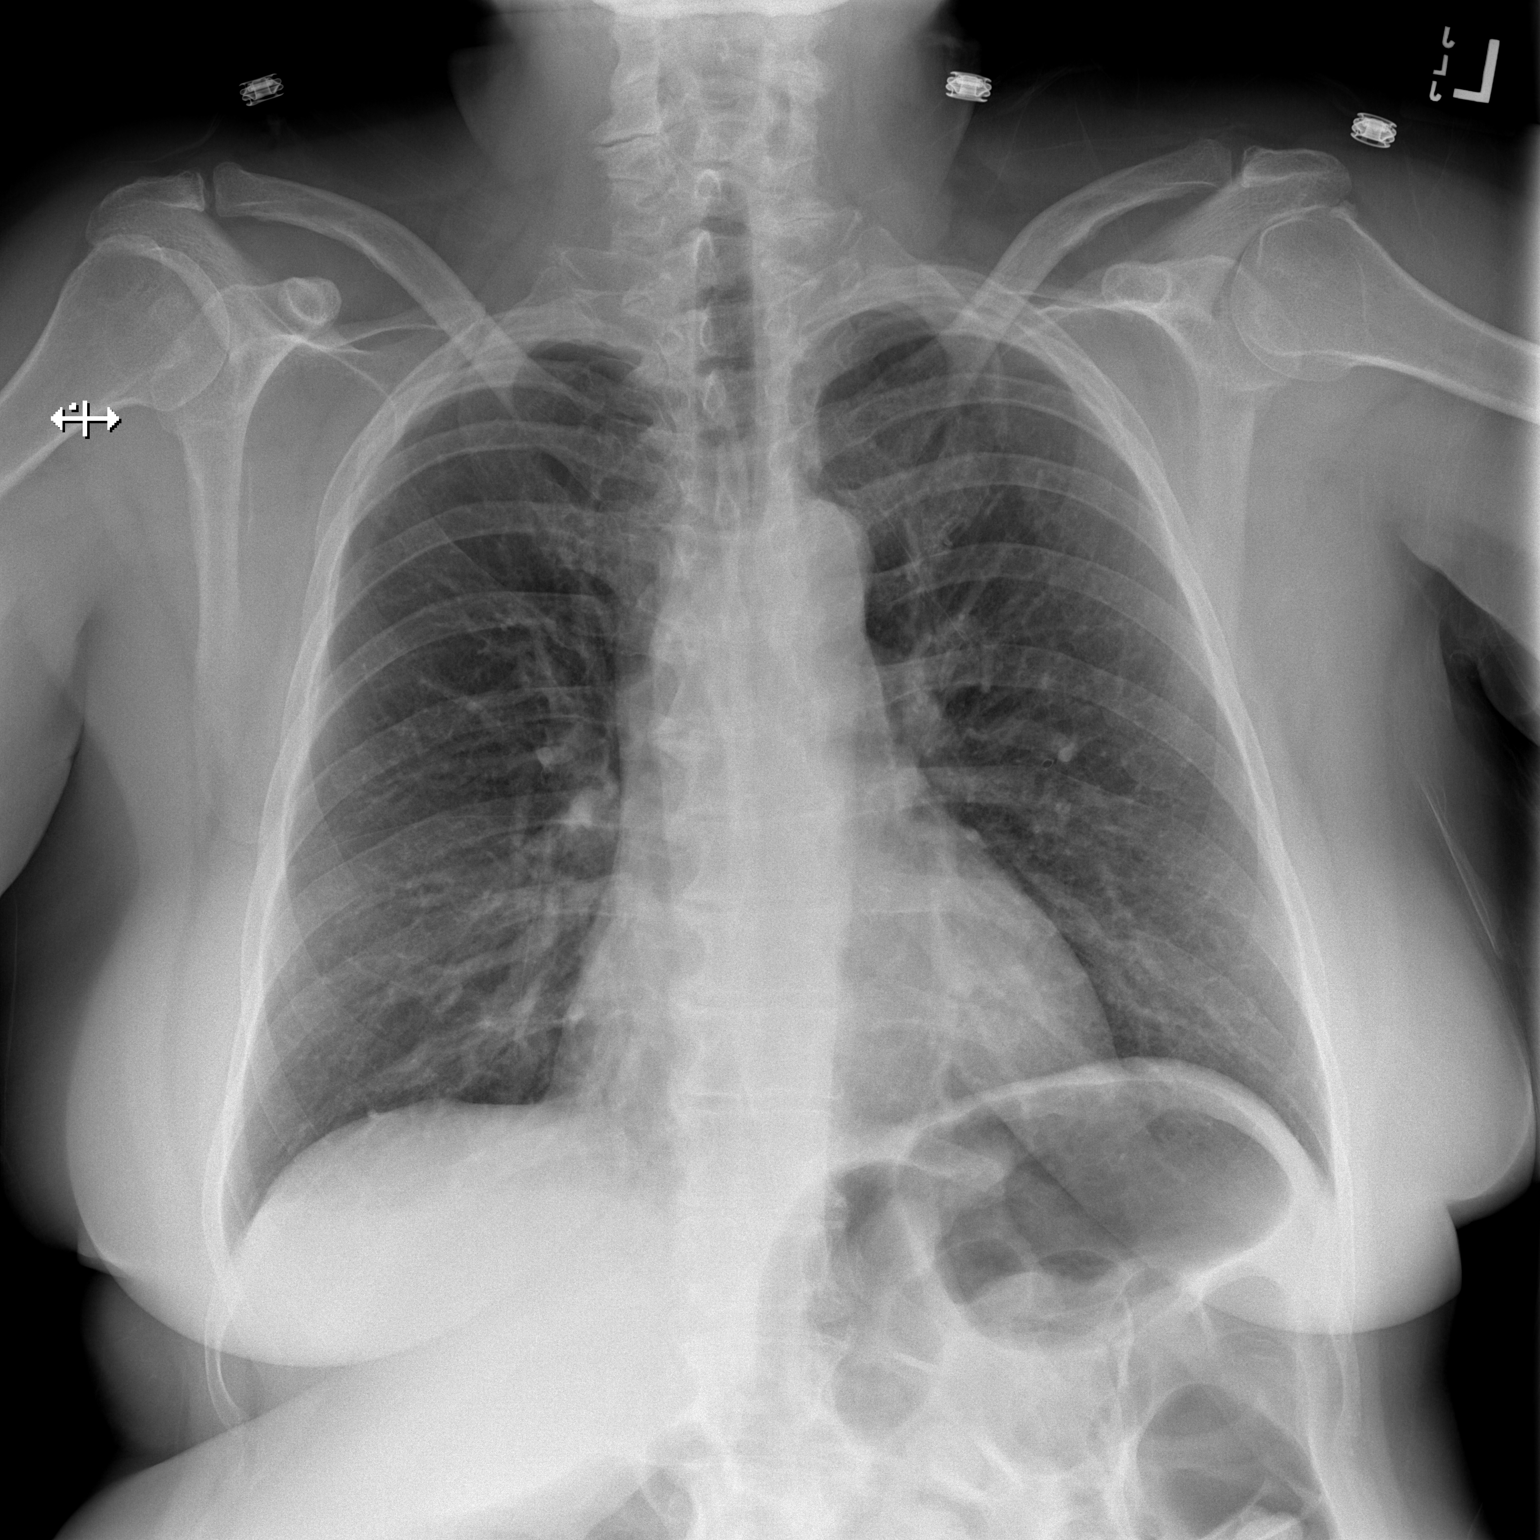

[w chest lat]
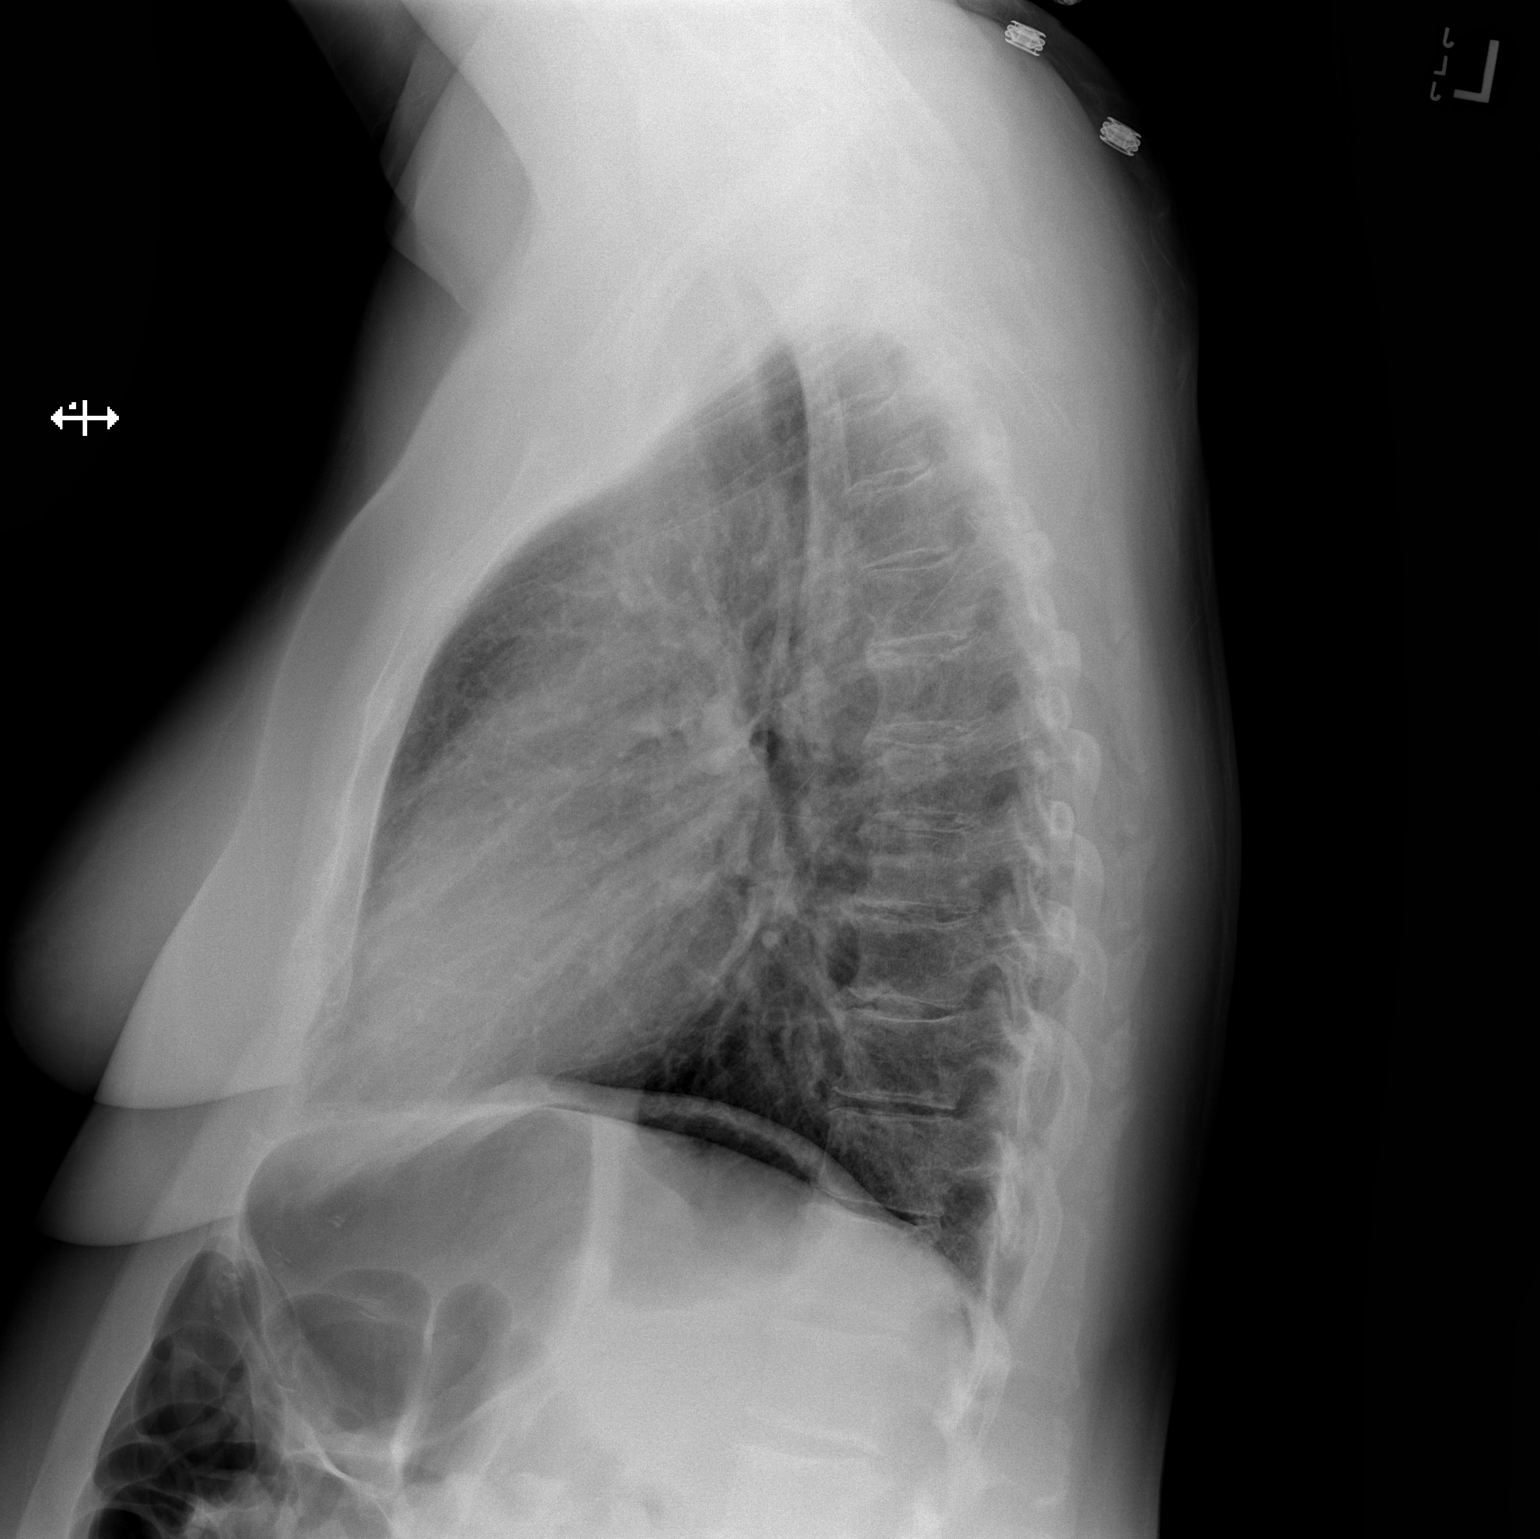

[2 of 2 positions shown; findings below may reference images not displayed]

FINDINGS: Normal heart size and pulmonary vascularity. No focal airspace
disease or consolidation in the lungs. No blunting of costophrenic
angles. No pneumothorax. Mediastinal contours appear intact. No
radiopaque foreign bodies identified. Degenerative changes in the
spine.
IMPRESSION: No active cardiopulmonary disease.

## 2019-12-16 ENCOUNTER — Ambulatory Visit: Payer: BC Managed Care – PPO

## 2020-03-14 ENCOUNTER — Other Ambulatory Visit: Payer: Self-pay | Admitting: Internal Medicine

## 2020-03-14 MED ORDER — BUDESONIDE-FORMOTEROL FUMARATE 80-4.5 MCG/ACT IN AERO: 2.0000 | INHALATION_SPRAY | Freq: Two times a day (BID) | RESPIRATORY_TRACT | 1 refills | Status: DC

## 2020-03-17 ENCOUNTER — Other Ambulatory Visit: Payer: Self-pay | Admitting: Internal Medicine

## 2020-03-17 MED ORDER — BUDESONIDE-FORMOTEROL FUMARATE 80-4.5 MCG/ACT IN AERO: 2.0000 | INHALATION_SPRAY | Freq: Two times a day (BID) | RESPIRATORY_TRACT | 1 refills | Status: DC

## 2020-04-06 ENCOUNTER — Encounter: Payer: Self-pay | Admitting: Internal Medicine

## 2020-04-14 ENCOUNTER — Ambulatory Visit (INDEPENDENT_AMBULATORY_CARE_PROVIDER_SITE_OTHER): Payer: 59 | Admitting: Internal Medicine

## 2020-04-14 ENCOUNTER — Encounter: Payer: Self-pay | Admitting: Internal Medicine

## 2020-04-14 ENCOUNTER — Other Ambulatory Visit: Payer: Self-pay

## 2020-04-14 ENCOUNTER — Ambulatory Visit (INDEPENDENT_AMBULATORY_CARE_PROVIDER_SITE_OTHER): Payer: 59

## 2020-04-14 DIAGNOSIS — J310 Chronic rhinitis: Secondary | ICD-10-CM

## 2020-04-14 DIAGNOSIS — J45991 Cough variant asthma: Secondary | ICD-10-CM

## 2020-04-14 LAB — CBC WITH DIFFERENTIAL/PLATELET
Basophils Absolute: 0.1 10*3/uL (ref 0.0–0.1)
Basophils Relative: 1.4 % (ref 0.0–3.0)
Eosinophils Absolute: 0.4 10*3/uL (ref 0.0–0.7)
Eosinophils Relative: 5.6 % — ABNORMAL HIGH (ref 0.0–5.0)
HCT: 44.3 % (ref 36.0–46.0)
Hemoglobin: 14.7 g/dL (ref 12.0–15.0)
Lymphocytes Relative: 33.2 % (ref 12.0–46.0)
Lymphs Abs: 2.4 10*3/uL (ref 0.7–4.0)
MCHC: 33.1 g/dL (ref 30.0–36.0)
MCV: 95.5 fl (ref 78.0–100.0)
Monocytes Absolute: 0.5 10*3/uL (ref 0.1–1.0)
Monocytes Relative: 7.5 % (ref 3.0–12.0)
Neutro Abs: 3.8 10*3/uL (ref 1.4–7.7)
Neutrophils Relative %: 52.3 % (ref 43.0–77.0)
Platelets: 265 10*3/uL (ref 150.0–400.0)
RBC: 4.64 Mil/uL (ref 3.87–5.11)
RDW: 12.5 % (ref 11.5–15.5)
WBC: 7.3 10*3/uL (ref 4.0–10.5)

## 2020-04-14 MED ORDER — BUDESONIDE-FORMOTEROL FUMARATE 160-4.5 MCG/ACT IN AERO: INHALATION_SPRAY | RESPIRATORY_TRACT | 12 refills | Status: AC

## 2020-04-14 NOTE — Patient Instructions (Signed)
Plan A = Automatic = Always=    Symbicort 160 Take 1-2 puffs first thing in am and then another 2 puffs about 12 hours later.   Work on inhaler technique:  relax and gently blow all the way out then take a nice smooth deep breath back in, triggering the inhaler at same time you start breathing in.  Hold for up to 5 seconds if you can. Blow out thru nose. Rinse and gargle with water when done      Plan B = Backup (to supplement plan A, not to replace it) Only use your albuterol inhaler as a rescue medication to be used if you can't catch your breath by resting or doing a relaxed purse lip breathing pattern.  - The less you use it, the better it will work when you need it. - Ok to use the inhaler up to 2 puffs  every 4 hours if you must but call for appointment if use goes up over your usual need - Don't leave home without it !!  (think of it like the spare tire for your car)   We will call to set up an ENT appt  Please remember to go to the lab and x-ray department   for your tests - we will call you with the results when they are available.     Please schedule a follow up visit in 12 months but call sooner if needed

## 2020-04-14 NOTE — Progress Notes (Signed)
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 Pulmonary Consult    Referred by Merri Brunette. Pt states dxed with Asthma approx 2 years ago. She c/o having a cold for the past few wks- cough with green sputum and wheezing. She is using her albuterol inhaler about 2 x per day.   Dyspnea:  Baseline able to Heavy house work / up and down steps / worse with cat exp Cough: worse x sev weeks/rx mucinex/so restarted advair 10 days and not really better up to twice daily with minimal sputum  Sleep: some sense of drainage disturbs sleep  SABA use: last 9 pm prior to OV   rec Singulair 10 mg one daily in pm  Plan A = Automatic = Symbicort 80 Take 2 puffs first thing in am and then another 2 puffs about 12 hours later.  Work on inhaler technique:   Plan B = Backup Only use your albuterol inhaler Please schedule a follow up office visit in 4 weeks, sooner if needed with pfts on return    12/30/2018  f/u ov/Endiya Klahr re:  Cough variant asthma  Chief Complaint  Patient presents with  . Follow-up    Pt states aspirated a peanut 12/23/2018- she was just able to cough this up this morning- breathing has improved since then. She use using her albuterol inhaler 2 x daily.   Dyspnea:  Prior to asp doing much better despite poor hfa on symb 80 2bid and had not yet started singulair  as rec  Cough: was better until aspiration > then severe hacking with assoc wheezing / traces of brb Sleeping: 2 pillows flat bed  Was fine until asp ? Related to drinking wine / now having to sit more upright  SABA use: not needed until aspiration  rec Work on inhaler technique:    Add singulair 10 mg in evening  Only use your albuterol (ventolin) as a rescue medication GERD diet   04/19/2019  f/u ov/Bayler Gehrig re: cough variant asthma > never started singulair  Chief Complaint  Patient presents with  . Follow-up    Breathing is overall doing well. She uses her ventolin once per wk on average.   Dyspnea:  Not limited by sob, steps  Cough:only with cat or obvious dust esp   Sleeping: flat bed, 2 pillows/ gerd better on diet  SABA use: once a week at most  02: none  rec Work on inhaler technique:  If still having symptoms especially if nasal in origin > add singulair x one month trial    04/14/2020  f/u ov/Desere Gwin re: symbicort 80 one bid (can't tolerate full dose due to shakes)  / has cat in house not in bedroom   Chief Complaint  Patient presents with  . Follow-up  asthma, cough with allergies   Dyspnea:  Doing better  Cough: convinced  assoc with pnds  Otherwise no cough  Sleeping: fine flat SABA use: none      No obvious day to day or daytime variability or assoc  purulent sputum or mucus plugs or hemoptysis or cp or chest tightness, subjective wheeze or overt   hb symptoms.   Sleeping  without nocturnal  or early am exacerbation  of respiratory  c/o's or need for noct saba. Also denies any obvious fluctuation of symptoms with weather or environmental changes or other aggravating or alleviating factors except as outlined above   No unusual exposure hx or h/o childhood pna/ asthma or knowledge of premature birth.  Current Allergies, Complete Past Medical History, Past Surgical History, Family History, and Social History were reviewed in [employer]  record.  ROS  The following are not active complaints unless bolded Hoarseness, sore throat, dysphagia, dental problems, itching, sneezing,  nasal congestion or discharge of excess mucus or purulent secretions, ear ache,   fever, chills, sweats, unintended wt loss or wt gain, classically pleuritic or exertional cp,  orthopnea pnd or arm/hand swelling  or leg swelling, presyncope, palpitations, abdominal pain, anorexia, nausea, vomiting, diarrhea  or change in bowel habits or change in bladder habits, change in stools or change in urine, dysuria, hematuria,  rash, arthralgias, visual complaints, headache, numbness, weakness or ataxia or problems with walking or coordination,  change in mood or  memory.        No outpatient medications have been marked as taking for the 04/14/20 encounter (Office Visit) with Tanda Rockers, MD.                       Objective:     amb mod obese wf nad   04/14/2020       210  04/19/2019       210   12/28/18 210 lb (95.3 kg)  11/29/15 215 lb (97.5 kg)    Vital signs reviewed  04/14/2020  - Note at rest 02 sats  98% on RA   HEENT : pt wearing mask not removed for exam due to covid -19 concerns.    NECK :  without JVD/Nodes/TM/ nl carotid upstrokes bilaterally   LUNGS: no acc muscle use,  Nl contour chest  A few insp sqeaks bilaterally without cough on insp or exp maneuvers   CV:  RRR  no s3 or murmur or increase in P2, and no edema   ABD:  soft and nontender with nl inspiratory excursion in the supine position. No bruits or organomegaly appreciated, bowel sounds nl  MS:  Nl gait/ ext warm without deformities, calf tenderness, cyanosis or clubbing No obvious joint restrictions   SKIN: warm and dry without lesions    NEURO:  alert, approp, nl sensorium with  no motor or cerebellar deficits apparent.     CXR PA and Lateral:   04/14/2020 :    I personally reviewed images and agree with radiology impression as follows:     No active  cardiopulmonary disease.  Labs ordered 04/14/2020  :  allergy profile       Assessment

## 2020-04-15 ENCOUNTER — Encounter: Payer: Self-pay | Admitting: Internal Medicine

## 2020-04-15 NOTE — Assessment & Plan Note (Signed)
Onset around 2016 "when cat moved in" - FENO 12/17/2018  =   13 while on advair 250 bid  - 12/17/2018    try symb 80 2bid   - 12/30/2018  After extensive coaching inhaler device,  effectiveness =    75% so continue symb 80 and add singulair 10 mg daily > never started (boxed warning re mood changes)  - PFT's  01/15/2019  FEV1 2.50 (94 % ) ratio 0.80  p 4 % improvement from saba p nothing  prior to study with DLCO  107 % corrects to 113 % for alv volume  With no curvature to f/v    - 04/14/2020  After extensive coaching inhaler device,  effectiveness =    50% > changed symb to 160 one bid as can't tol full dose formoterol   - Allergy profile 04/14/2020 >  Eos 0.5 /  IgE   - referred to ENT 04/14/2020

## 2020-04-15 NOTE — Assessment & Plan Note (Addendum)
Trial of singulair 12/30/2018 / can't tol nasal steroids> never took singulair due to reported cns side effects  - Allergy profile 04/14/2020 >  Eos 0.5/  IgE    - 04/14/2020 referred to ent   Pt requesting ent eval fist but suspect she'll need formal allergy referral next as poorly controlled rhitis and multiple drug intolerances are limiting other options for rx.          Each maintenance medication was reviewed in detail including emphasizing most importantly the difference between maintenance and prns and under what circumstances the prns are to be triggered using an action plan format where appropriate.  Total time for H and P, chart review, counseling,   and generating customized AVS unique to this office visit / charting = 20 min

## 2020-04-17 LAB — IGE: IgE (Immunoglobulin E), Serum: 212 kU/L — ABNORMAL HIGH (ref <=114)

## 2020-04-17 NOTE — Progress Notes (Signed)
LMTCB

## 2020-04-17 NOTE — Progress Notes (Signed)
LMTCB X1 

## 2020-04-18 ENCOUNTER — Ambulatory Visit: Payer: BC Managed Care – PPO | Admitting: Internal Medicine

## 2020-04-18 NOTE — Progress Notes (Signed)
LMTCB

## 2020-04-24 ENCOUNTER — Encounter: Payer: Self-pay | Admitting: Internal Medicine

## 2020-04-24 NOTE — Progress Notes (Signed)
LMTCB and will mail letter

## 2021-04-17 ENCOUNTER — Ambulatory Visit: Payer: 59 | Admitting: Internal Medicine

## 2021-11-15 DIAGNOSIS — R3915 Urgency of urination: Secondary | ICD-10-CM | POA: Diagnosis not present

## 2021-11-15 DIAGNOSIS — R5383 Other fatigue: Secondary | ICD-10-CM | POA: Diagnosis not present

## 2021-11-15 DIAGNOSIS — Z0000 Encounter for general adult medical examination without abnormal findings: Secondary | ICD-10-CM | POA: Diagnosis not present

## 2021-11-22 DIAGNOSIS — J4520 Mild intermittent asthma, uncomplicated: Secondary | ICD-10-CM | POA: Diagnosis not present

## 2021-11-22 DIAGNOSIS — Z0000 Encounter for general adult medical examination without abnormal findings: Secondary | ICD-10-CM | POA: Diagnosis not present

## 2021-11-22 DIAGNOSIS — L719 Rosacea, unspecified: Secondary | ICD-10-CM | POA: Diagnosis not present

## 2021-11-22 DIAGNOSIS — Z01419 Encounter for gynecological examination (general) (routine) without abnormal findings: Secondary | ICD-10-CM | POA: Diagnosis not present

## 2021-11-22 DIAGNOSIS — Z1151 Encounter for screening for human papillomavirus (HPV): Secondary | ICD-10-CM | POA: Diagnosis not present

## 2022-03-05 DIAGNOSIS — J309 Allergic rhinitis, unspecified: Secondary | ICD-10-CM | POA: Diagnosis not present

## 2022-03-05 DIAGNOSIS — B370 Candidal stomatitis: Secondary | ICD-10-CM | POA: Diagnosis not present

## 2022-03-05 DIAGNOSIS — J4520 Mild intermittent asthma, uncomplicated: Secondary | ICD-10-CM | POA: Diagnosis not present

## 2022-04-24 DIAGNOSIS — H2511 Age-related nuclear cataract, right eye: Secondary | ICD-10-CM | POA: Diagnosis not present

## 2022-04-24 DIAGNOSIS — H25812 Combined forms of age-related cataract, left eye: Secondary | ICD-10-CM | POA: Diagnosis not present

## 2022-04-24 DIAGNOSIS — H401430 Capsular glaucoma with pseudoexfoliation of lens, bilateral, stage unspecified: Secondary | ICD-10-CM | POA: Diagnosis not present

## 2024-07-19 ENCOUNTER — Other Ambulatory Visit (HOSPITAL_COMMUNITY): Payer: Self-pay

## 2024-07-19 MED ORDER — BUDESONIDE-FORMOTEROL FUMARATE 160-4.5 MCG/ACT IN AERO
2.0000 | INHALATION_SPRAY | Freq: Two times a day (BID) | RESPIRATORY_TRACT | 2 refills | Status: AC
  Filled 2024-07-19: qty 10.2, 30d supply, fill #0
  Filled 2024-11-01: qty 10.2, 30d supply, fill #1

## 2024-07-19 MED ORDER — NIRMATRELVIR&RITONAVIR 300/100 20 X 150 MG & 10 X 100MG PO TBPK
3.0000 | ORAL_TABLET | Freq: Two times a day (BID) | ORAL | 0 refills | Status: AC
  Filled 2024-07-19: qty 30, 5d supply, fill #0

## 2024-07-20 ENCOUNTER — Other Ambulatory Visit (HOSPITAL_COMMUNITY): Payer: Self-pay

## 2024-07-20 ENCOUNTER — Other Ambulatory Visit: Payer: Self-pay

## 2024-11-01 ENCOUNTER — Other Ambulatory Visit (HOSPITAL_COMMUNITY): Payer: Self-pay

## 2024-11-25 ENCOUNTER — Encounter (INDEPENDENT_AMBULATORY_CARE_PROVIDER_SITE_OTHER): Payer: Self-pay

## 2024-12-28 ENCOUNTER — Institutional Professional Consult (permissible substitution) (INDEPENDENT_AMBULATORY_CARE_PROVIDER_SITE_OTHER): Payer: Self-pay | Admitting: Otolaryngology
